# Patient Record
Sex: Male | Born: 1965 | Race: White | Marital: Married | Smoking: Never smoker
Health system: Northeastern US, Academic
[De-identification: ages and names within clinical notes are randomized; demographics above are authoritative.]

## PROBLEM LIST (undated history)

## (undated) DIAGNOSIS — G4730 Sleep apnea, unspecified: Secondary | ICD-10-CM

## (undated) DIAGNOSIS — N323 Diverticulum of bladder: Secondary | ICD-10-CM

## (undated) DIAGNOSIS — I2510 Atherosclerotic heart disease of native coronary artery without angina pectoris: Secondary | ICD-10-CM

## (undated) DIAGNOSIS — N151 Renal and perinephric abscess: Secondary | ICD-10-CM

## (undated) DIAGNOSIS — I10 Essential (primary) hypertension: Secondary | ICD-10-CM

## (undated) DIAGNOSIS — R0600 Dyspnea, unspecified: Secondary | ICD-10-CM

## (undated) DIAGNOSIS — E119 Type 2 diabetes mellitus without complications: Secondary | ICD-10-CM

## (undated) DIAGNOSIS — E108 Type 1 diabetes mellitus with unspecified complications: Secondary | ICD-10-CM

## (undated) HISTORY — DX: Type 1 diabetes mellitus with unspecified complications: E10.8

## (undated) HISTORY — DX: Essential (primary) hypertension: I10

## (undated) HISTORY — PX: COLONOSCOPY: SHX174

## (undated) HISTORY — PX: TONSILLECTOMY: SUR1361

## (undated) HISTORY — PX: WRIST FRACTURE SURGERY: SHX121

## (undated) HISTORY — PX: SHOULDER SURGERY: SHX246

---

## 2006-12-21 ENCOUNTER — Encounter: Admission: RE | Admit: 2006-12-21 | Discharge: 2007-02-10 | Payer: Self-pay | Admitting: Family Medicine

## 2007-02-12 ENCOUNTER — Encounter: Admission: RE | Admit: 2007-02-12 | Discharge: 2007-02-15 | Payer: Self-pay | Admitting: Family Medicine

## 2007-03-29 ENCOUNTER — Ambulatory Visit (HOSPITAL_COMMUNITY): Admission: RE | Admit: 2007-03-29 | Discharge: 2007-03-29 | Payer: Self-pay | Admitting: Orthopedic Surgery

## 2007-04-01 ENCOUNTER — Encounter: Admission: RE | Admit: 2007-04-01 | Discharge: 2007-06-16 | Payer: Self-pay | Admitting: Orthopedic Surgery

## 2010-04-24 ENCOUNTER — Other Ambulatory Visit: Payer: Self-pay | Admitting: Orthopedic Surgery

## 2010-04-24 ENCOUNTER — Ambulatory Visit
Admission: RE | Admit: 2010-04-24 | Discharge: 2010-04-24 | Disposition: A | Discharge status: Home / Self Care | Payer: Self-pay | Source: Ambulatory Visit | Attending: Orthopedic Surgery | Admitting: Orthopedic Surgery

## 2010-04-24 DIAGNOSIS — M25532 Pain in left wrist: Secondary | ICD-10-CM

## 2010-04-24 DIAGNOSIS — R52 Pain, unspecified: Secondary | ICD-10-CM

## 2010-06-25 NOTE — Op Note (Signed)
NAME:  Ryan Ramirez, Ryan Ramirez                   ACCOUNT NO.:  [id number]   MEDICAL RECORD NO.:  [id number]          PATIENT TYPE:  AMB   LOCATION:  SDS                          FACILITY:  [facility]   PHYSICIAN:  Feliberto Gottron. Turner Daniels, M.D.   DATE OF BIRTH:  12-09-1965   DATE OF PROCEDURE:  03/29/2007  DATE OF DISCHARGE:  03/29/2007                               OPERATIVE REPORT   PREOPERATIVE DIAGNOSIS:  Left shoulder adhesive capsulitis chronic with  impingement.   POSTOPERATIVE DIAGNOSIS:  Left shoulder adhesive capsulitis chronic with  impingement.   PROCEDURE:  Left shoulder arthroscopic lysis of adhesions, acromioplasty  and an intra-articular lysis of adhesions as well in the glenohumeral  joint.   SURGEON:  Feliberto Gottron.  Turner Daniels, MD   FIRST ASSISTANT:  Skip Mayer PA-C.   ANESTHETIC:  Left interscalene block with endotracheal.   ESTIMATED BLOOD LOSS:  Minimal.   FLUID REPLACEMENT:  800 mL crystalloid.   DRAINS PLACED:  None.   TOURNIQUET TIME:  None.   INDICATIONS FOR PROCEDURE:  45 year old insulin-dependent diabetic  controlled with an insulin pump and A1c's around 6.  Last year in  another city he had treatment for right shoulder adhesive capsulitis  with physical therapy, anti-inflammatory medicine, ultimately had to  have arthroscopic lysis of adhesions, manipulation and acromioplasty by  another physician in another city.  This went well.  He has developed  left shoulder adhesive capsulitis and blocks at about 5 degrees of  external rotation and once again has failed conservative treatment, with  anti-inflammatory medicines and extensive visits to the physical  therapy, at least 20.  He desires elective lysis of adhesions,  acromioplasty for impingement pain and then immediate physical therapy  to regain motion on the left side.  Risks and benefits of surgery are  well-known and questions have been answered.   DESCRIPTION OF PROCEDURE:  The patient was taken the block area at [facility] and underwent left shoulder interscalene block anesthetic.  He  was then taken to the operating room 2 and underwent general anesthesia  and was placed in the beach chair position, left upper extremity  prepped, draped in usual sterile fashion from the wrist to the  hemithorax and we began the procedure by making standard portals 1.5 cm  anterior to the [facility] joint, lateral to the junction of middle and  posterior thirds of acromion and posterior to the posterolateral corner  of the acromion process.  The inflow was placed anteriorly.  The  arthroscope laterally and a 4.2 great white sucker shaver posteriorly.  We immediately encountered adhesions in the subacromial bursal region  and these were resected revealing an intact rotator cuff.  The patient  did have a 6 mm type 2 subacromial spur.  This was removed with a 4.5  hooded vortex bur and small bleeders were cauterized with a hooked  Bovie.  We then went around the greater tuberosity and lysed some more  adhesions and by this time he had gained 60 degrees of external  rotation.  Flexion was up to about 130 degrees having  been at around 95.  At this point the inflow was attached.  The arthroscope was then  repositioned in the glenohumeral joint where we documented an intact  labrum, biceps, biceps anchor and subscapularis tendon.  The rotator  cuff was intact internally.  There were some small adhesions around the  periphery of the glenoid and these were lysed with a 3.5 gator sucker  shaver.  The shoulder was then irrigated out with normal saline  solution.  The arthroscopic consents were removed and a dressing of  Xeroform, 4x4 dressing sponges, paper tape and a sling applied.  The  patient was laid supine, awakened and taken to the recovery room without  difficulty.      Feliberto Gottron. Turner Daniels, M.D.  Electronically Signed     FJR/MEDQ  D:  03/29/2007  T:  03/30/2007  Job:  [id number]

## 2010-11-01 LAB — COMPREHENSIVE METABOLIC PANEL
ALT: 35
AST: 25
Albumin: 4.2
Alkaline Phosphatase: 45
BUN: 13
Chloride: 103
Creatinine, Ser: 1.1
GFR calc non Af Amer: >60
Glucose, Bld: 185 — ABNORMAL HIGH

## 2010-11-01 LAB — CBC
HCT: 45.1
MCV: 88.1
Platelets: 317
RDW: 13
WBC: 7.8

## 2010-11-01 LAB — DIFFERENTIAL
Basophils Relative: 0
Eosinophils Absolute: 0.2
Eosinophils Relative: 2
Lymphs Abs: 2.8
Monocytes Relative: 11

## 2013-03-14 DIAGNOSIS — E785 Hyperlipidemia, unspecified: Secondary | ICD-10-CM | POA: Insufficient documentation

## 2014-05-08 DIAGNOSIS — E10319 Type 1 diabetes mellitus with unspecified diabetic retinopathy without macular edema: Secondary | ICD-10-CM | POA: Insufficient documentation

## 2016-06-07 DIAGNOSIS — I2510 Atherosclerotic heart disease of native coronary artery without angina pectoris: Secondary | ICD-10-CM | POA: Insufficient documentation

## 2016-11-09 DIAGNOSIS — R0609 Other forms of dyspnea: Secondary | ICD-10-CM | POA: Diagnosis present

## 2016-11-09 NOTE — H&P (Signed)
OFFICE VISIT NOTES COPIED TO EPIC FOR DOCUMENTATION  . History of Present Illness Gwinda Maine FNP-C; 10/22/2016 4:54 PM) Patient words: Last OV 09/09/2016; 4-6 week f/u for sob; starting vitamin c.  The patient is a 51 year old male who presents for a Follow-up for Dyspnea. Patient with past medical history of type 1 diabetes, hyperlipidemia, obstructive sleep apnea, and hypertension presents for follow-up for shortness of breath. Patient was recently noted to have mildly abnormal nuclear stress testing after he complained of dyspnea on exertion and chest discomfort. Dyspnea was felt to be out of porportion of activity. He was started on medical management and is now on losartan and carvedilol that he is tolerating well. He has not had any further episodes of chest pain. He does have dyspnea on exertion and was advised to start regular exercise to see if this was related to previous inactivity. He reports he has not been able to exercise regularly due to having hypoglycemia episodes. He reports recently having insulin adjusted by Endocrinology.  He reports that he feels medications have improved his symptoms slightly; however, does continue to have shortness of breath at night on occasion that feels as though he cannot take a deep breath and dyspnea on exertion that does limit his activities. He had noticed that his heart rate does increase higher than it should with exertion.      Problem List/Past Medical Frances Furbish Johnson; 10/22/2016 3:05 PM) OSA (obstructive sleep apnea) (G47.33)  Benign essential hypertension (I10)  Type 1 diabetes mellitus without complication (C12.7)  Hyperlipidemia, mixed (E78.2)  Shortness of breath on exertion (R06.02)  Echocardiogram 07/16/2016: Left ventricle cavity is normal in size. Normal global wall motion. Normal diastolic filling pattern. Calculated EF 59%. Trivial aortic regurgitation. Trace mitral regurgitation. Trace tricuspid regurgitation. No  evidence of pulmonary hypertension. Trace pulmonic regurgitation. Atypical chest pain (R07.89)  Exercise sestamibi stress test 07/18/2016: 1. The resting electrocardiogram demonstrated normal sinus rhythm, incomplete RBBB and no resting arrhythmias. The stress electrocardiogram was normal. Patient exercised on Bruce protocol for 7:30 minutes and achieved 9:35 METS. Stress test terminated due to fatigue and 90% MPHR achieved (Target HR >85%). There is a moderate area of ischemia in the basal inferior and mid inferior myocardial wall(s). The left ventricular ejection fraction was calculated or visually estimated to be 62%. Overall left ventricular systolic function was normal with possible inferior hypokinesis. This is an intermediate risk study, clinical correlation recommended. Laboratory examination (Z01.89)  06/18/2016: HgbA1c 7.5%. 03/21/2016: Cholesterol 127, triglycerides 65, HDL 49, LDL 65. Creatinine 0.9, potassium 4.6, AST 28, ALT 55, CMP otherwise normal. Abnormal nuclear stress test (R94.39)   Allergies Frances Furbish Johnson; 10/22/2016 3:05 PM) No Known Drug Allergies [07/14/2016]:  Family History Cheri Kearns; 10/22/2016 3:05 PM) Mother  In good health. no heart attacks or strokes, no known cardiovascular conditions Father  In good health. no heart attacks or strokes, no known cardiovascular conditions Siblings  Patient has a total of 2 siblings, all of whom are in stable health with no heart attacks or strokes, no other known cardiovascular conditions  Social History Cheri Kearns; 10/22/2016 3:05 PM) Marital status  Married. Living Situation  Lives with spouse. Number of Children  1. Current tobacco use  Never smoker. Alcohol Use  Moderate alcohol use.  Past Surgical History Cheri Kearns; 10/22/2016 3:05 PM) Arthroscopic Shoulder Surgery - Both [2010]: Tonsillectomy [1976]: Wrist Surgery [2014]: fracture repair  Medication History Frances Furbish Johnson; 10/22/2016 3:13  PM) Carvedilol (6.25MG Tablet, 1 (one) Tablet Oral two  times daily, Taken starting 10/03/2016) Active. Nitroglycerin (0.4MG Tab Sublingual, 1 (one) Tablet Sublingual every 5 minutes as needed for chest pain., Taken starting 09/09/2016) Active. Cialis (20MG Tablet, 1 Oral as needed) Active. Losartan Potassium (25MG Tablet, 1 Oral daily) Active. Simvastatin (20MG Tablet, 1 Oral daily) Active. Aspirin (325MG Tablet, 1 Oral daily) Active. Zinc (10MG Tablet, 1 Oral daily) Active. PhytoMulti (1 Oral daily) Active. K2-D3 (1 daily) Active. CoQ-10 (100MG Capsule, 1 Oral daily) Active. Florify (1 daily) Active. ProvexCV (1 daily) Active. Recover Al (1 daily) Active. HumaLOG (100UNIT/ML Solution, pump Subcutaneous daily) Active. Medications Reconciled (verbally with pt)  Diagnostic Studies History Cheri Kearns; 09-Nov-2016 3:05 PM) Colonoscopy [2013]: Nuclear stress test [07/18/2016]: The resting electrocardiogram demonstrated normal sinus rhythm, incomplete RBBB and no resting arrhythmias. The stress electrocardiogram was normal. Patient exercised on Bruce protocol for 7:30 minutes and achieved 9:35 METS. Stress test terminated due to fatigue and 90% MPHR achieved (Target HR >85%). There is a moderate area of ischemia in the basal inferior and mid inferior myocardial wall(s). The left ventricular ejection fraction was calculated or visually estimated to be 62%. Overall left ventricular systolic function was normal with possible inferior hypokinesis. This is an intermediate risk study, clinical correlation recommended. Sleep Study [2016]: Echocardiogram [07/16/2016]: Left ventricle cavity is normal in size. Normal global wall motion. Normal diastolic filling pattern. Calculated EF 59%. Trivial aortic regurgitation. Trace mitral regurgitation. Trace tricuspid regurgitation. No evidence of pulmonary hypertension. Trace pulmonic regurgitation.    Review of Systems Laverda Page MD; Nov 09, 2016 3:35 PM) General Not Present- Appetite Loss and Weight Gain. Respiratory Not Present- Chronic Cough and Wakes up from Sleep Wheezing or Short of Breath. Cardiovascular Present- Difficulty Breathing Lying Down and Difficulty Breathing On Exertion. Not Present- Chest Pain, Claudications and Palpitations. Gastrointestinal Not Present- Black, Tarry Stool and Difficulty Swallowing. Musculoskeletal Not Present- Decreased Range of Motion and Muscle Atrophy. Neurological Not Present- Attention Deficit. Psychiatric Not Present- Personality Changes and Suicidal Ideation. Endocrine Not Present- Cold Intolerance and Heat Intolerance. Hematology Not Present- Abnormal Bleeding. All other systems negative  Vitals Frances Furbish Johnson; 2016/11/09 3:17 PM) 11-09-2016 3:07 PM Weight: 177.31 lb Height: 68in Body Surface Area: 1.94 m Body Mass Index: 26.96 kg/m  Pulse: 78 (Regular)  P.OX: 99% (Room air) BP: 122/80 (Sitting, Right Arm, Standard)       Physical Exam Gwinda Maine, FNP-C; 09-Nov-2016 4:54 PM) General Mental Status-Alert. General Appearance-Cooperative and Appears stated age. Build & Nutrition-Moderately built.  Head and Neck Thyroid Gland Characteristics - normal size and consistency and no palpable nodules.  Chest and Lung Exam Chest and lung exam reveals -quiet, even and easy respiratory effort with no use of accessory muscles, non-tender and on auscultation, normal breath sounds, no adventitious sounds.  Cardiovascular Cardiovascular examination reveals -normal heart sounds, regular rate and rhythm with no murmurs, carotid auscultation reveals no bruits, abdominal aorta auscultation reveals no bruits and no prominent pulsation, femoral artery auscultation bilaterally reveals normal pulses, no bruits, no thrills, normal pedal pulses bilaterally and no digital clubbing, cyanosis, edema, increased warmth or  tenderness.  Abdomen Palpation/Percussion Normal exam - Non Tender and No hepatosplenomegaly.  Neurologic Neurologic evaluation reveals -alert and oriented x 3 with no impairment of recent or remote memory. Motor-Grossly intact without any focal deficits.  Musculoskeletal Global Assessment Left Lower Extremity - no deformities, masses or tenderness, no known fractures. Right Lower Extremity - no deformities, masses or tenderness, no known fractures.    Assessment & Plan Miquel Dunn Peggye Form FNP-C;  10/22/2016 4:52 PM) Shortness of breath on exertion (R06.02) Story: Echocardiogram 07/16/2016: Left ventricle cavity is normal in size. Normal global wall motion. Normal diastolic filling pattern. Calculated EF 59%. Trivial aortic regurgitation. Trace mitral regurgitation. Trace tricuspid regurgitation. No evidence of pulmonary hypertension. Trace pulmonic regurgitation. Atypical chest pain - Status is Resolved (R07.89) Story: Exercise sestamibi stress test 07/18/2016: 1. The resting electrocardiogram demonstrated normal sinus rhythm, incomplete RBBB and no resting arrhythmias. The stress electrocardiogram was normal. Patient exercised on Bruce protocol for 7:30 minutes and achieved 9:35 METS. Stress test terminated due to fatigue and 90% MPHR achieved (Target HR >85%). There is a moderate area of ischemia in the basal inferior and mid inferior myocardial wall(s). The left ventricular ejection fraction was calculated or visually estimated to be 62%. Overall left ventricular systolic function was normal with possible inferior hypokinesis. This is an intermediate risk study, clinical correlation recommended.  Benign essential hypertension (I10) Impression: EKG 07/14/2016: Normal sinus rhythm at 78 bpm, normal axis, normal interval, no evidence of ischemia. Normal EKG. Type 1 diabetes mellitus without complication (V77.9) Abnormal nuclear stress test (R94.39) Laboratory examination  (T90.30) Story: 11/06/2016: Glucose 1:15, he creatinine 1.02, EGFR 85/99, potassium 5.1, BMP normal.  CBC normal. INR 1.0, prothrombin time 10.0.  06/18/2016: HgbA1c 7.5%.  03/21/2016: Cholesterol 127, triglycerides 65, HDL 49, LDL 65. Creatinine 0.9, potassium 4.6, AST 28, ALT 55, CMP otherwise normal.  Recommendations:  Patient presents for 6 week follow-up for shortness of breath on exertion and abnormal nuclear stress test. Since being on medical management he does feel that symptoms have slightly improved; however, does continue to have dyspnea on exertion and shortness of breath at night that are concerning for angina equivalent symptoms. Given his risk factors and continued symptoms that he does admit have been keeping him from doing normal activities on occasion would recommend further evaluation with coronary angiogram. Schedule for cardiac catheterization, and possible angioplasty. We discussed regarding risks, benefits, alternatives to this including stress testing, CTA and continued medical therapy. Patient wants to proceed. Understands <1-2% risk of death, stroke, MI, urgent CABG, bleeding, infection, renal failure but not limited to these. Blood pressure remains stable. He has recently had insulin adjustment for hypoglycemia. Discussed using glucose tablets as needed. We will see him back after the procedure for further recommendations and evaluation.  *I have discussed this case with Dr. Einar Gip and he personally examined the patient and participated in formulating the plan.*  CC: Dr. Orpah Melter  Signed electronically by Gwinda Maine, FNP-C (10/22/2016 4:55 PM)

## 2016-11-11 ENCOUNTER — Encounter (HOSPITAL_COMMUNITY): Payer: Self-pay | Admitting: Cardiology

## 2016-11-11 ENCOUNTER — Ambulatory Visit (HOSPITAL_COMMUNITY)
Admission: RE | Admit: 2016-11-11 | Discharge: 2016-11-11 | Disposition: A | Discharge status: Home / Self Care | Payer: BLUE CROSS/BLUE SHIELD | Source: Ambulatory Visit | Attending: Cardiology | Admitting: Cardiology

## 2016-11-11 ENCOUNTER — Encounter (HOSPITAL_COMMUNITY)
Admission: RE | Disposition: A | Discharge status: Home / Self Care | Payer: Self-pay | Source: Ambulatory Visit | Attending: Cardiology

## 2016-11-11 DIAGNOSIS — Z794 Long term (current) use of insulin: Secondary | ICD-10-CM | POA: Insufficient documentation

## 2016-11-11 DIAGNOSIS — I10 Essential (primary) hypertension: Secondary | ICD-10-CM | POA: Insufficient documentation

## 2016-11-11 DIAGNOSIS — R0609 Other forms of dyspnea: Secondary | ICD-10-CM | POA: Insufficient documentation

## 2016-11-11 DIAGNOSIS — R9439 Abnormal result of other cardiovascular function study: Secondary | ICD-10-CM | POA: Insufficient documentation

## 2016-11-11 DIAGNOSIS — E109 Type 1 diabetes mellitus without complications: Secondary | ICD-10-CM | POA: Insufficient documentation

## 2016-11-11 DIAGNOSIS — Z7982 Long term (current) use of aspirin: Secondary | ICD-10-CM | POA: Insufficient documentation

## 2016-11-11 DIAGNOSIS — Z79899 Other long term (current) drug therapy: Secondary | ICD-10-CM | POA: Diagnosis not present

## 2016-11-11 DIAGNOSIS — G4733 Obstructive sleep apnea (adult) (pediatric): Secondary | ICD-10-CM | POA: Diagnosis not present

## 2016-11-11 DIAGNOSIS — E785 Hyperlipidemia, unspecified: Secondary | ICD-10-CM | POA: Insufficient documentation

## 2016-11-11 HISTORY — PX: LEFT HEART CATH AND CORONARY ANGIOGRAPHY: CATH118249

## 2016-11-11 HISTORY — PX: ULTRASOUND GUIDANCE FOR VASCULAR ACCESS: SHX6516

## 2016-11-11 LAB — GLUCOSE, CAPILLARY: GLUCOSE-CAPILLARY: 230 mg/dL — AB (ref 65–99)

## 2016-11-11 SURGERY — LEFT HEART CATH AND CORONARY ANGIOGRAPHY
Anesthesia: LOCAL

## 2016-11-11 MED ORDER — SODIUM CHLORIDE 0.9 % WEIGHT BASED INFUSION: 1.0000 mL/kg/h | INTRAVENOUS | Status: DC

## 2016-11-11 MED ORDER — VERAPAMIL HCL 2.5 MG/ML IV SOLN
INTRAVENOUS | Status: DC | PRN
  Administered 2016-11-11: 5 mL via INTRA_ARTERIAL

## 2016-11-11 MED ORDER — FENTANYL CITRATE (PF) 100 MCG/2ML IJ SOLN
INTRAMUSCULAR | Status: AC
  Filled 2016-11-11: qty 2

## 2016-11-11 MED ORDER — NITROGLYCERIN 0.4 MG SL SUBL
SUBLINGUAL_TABLET | SUBLINGUAL | Status: AC
  Filled 2016-11-11: qty 1

## 2016-11-11 MED ORDER — NITROGLYCERIN 0.4 MG SL SUBL
SUBLINGUAL_TABLET | SUBLINGUAL | Status: DC | PRN
  Administered 2016-11-11: .4 mg via SUBLINGUAL

## 2016-11-11 MED ORDER — ASPIRIN 81 MG PO CHEW: 81.0000 mg | CHEWABLE_TABLET | ORAL | Status: DC

## 2016-11-11 MED ORDER — ONDANSETRON HCL 4 MG/2ML IJ SOLN: 4.0000 mg | Freq: Four times a day (QID) | INTRAMUSCULAR | Status: DC | PRN

## 2016-11-11 MED ORDER — FENTANYL CITRATE (PF) 100 MCG/2ML IJ SOLN
INTRAMUSCULAR | Status: DC | PRN
  Administered 2016-11-11: 50 ug via INTRAVENOUS

## 2016-11-11 MED ORDER — SODIUM CHLORIDE 0.9 % IV SOLN: INTRAVENOUS | Status: DC

## 2016-11-11 MED ORDER — ACETAMINOPHEN 325 MG PO TABS: 650.0000 mg | ORAL_TABLET | ORAL | Status: DC | PRN

## 2016-11-11 MED ORDER — LIDOCAINE HCL (PF) 1 % IJ SOLN
INTRAMUSCULAR | Status: DC | PRN
  Administered 2016-11-11: 2 mL

## 2016-11-11 MED ORDER — IOPAMIDOL (ISOVUE-370) INJECTION 76%
INTRAVENOUS | Status: DC | PRN
  Administered 2016-11-11: 55 mL via INTRA_ARTERIAL

## 2016-11-11 MED ORDER — SODIUM CHLORIDE 0.9% FLUSH: 3.0000 mL | Freq: Two times a day (BID) | INTRAVENOUS | Status: DC

## 2016-11-11 MED ORDER — LIDOCAINE HCL 2 % IJ SOLN
INTRAMUSCULAR | Status: AC
  Filled 2016-11-11: qty 10

## 2016-11-11 MED ORDER — MIDAZOLAM HCL 2 MG/2ML IJ SOLN
INTRAMUSCULAR | Status: AC
  Filled 2016-11-11: qty 2

## 2016-11-11 MED ORDER — VERAPAMIL HCL 2.5 MG/ML IV SOLN
INTRAVENOUS | Status: AC
Start: 2016-11-11 — End: ?
  Filled 2016-11-11: qty 2

## 2016-11-11 MED ORDER — HEPARIN (PORCINE) IN NACL 2-0.9 UNIT/ML-% IJ SOLN
INTRAMUSCULAR | Status: AC | PRN
  Administered 2016-11-11: 1000 mL

## 2016-11-11 MED ORDER — SODIUM CHLORIDE 0.9% FLUSH: 3.0000 mL | INTRAVENOUS | Status: DC | PRN

## 2016-11-11 MED ORDER — HEPARIN SODIUM (PORCINE) 1000 UNIT/ML IJ SOLN
INTRAMUSCULAR | Status: DC | PRN
  Administered 2016-11-11: 2000 [IU] via INTRAVENOUS
  Administered 2016-11-11: 3000 [IU] via INTRAVENOUS

## 2016-11-11 MED ORDER — SODIUM CHLORIDE 0.9 % WEIGHT BASED INFUSION
3.0000 mL/kg/h | INTRAVENOUS | Status: AC
  Administered 2016-11-11: 3 mL/kg/h via INTRAVENOUS

## 2016-11-11 MED ORDER — HEPARIN SODIUM (PORCINE) 1000 UNIT/ML IJ SOLN
INTRAMUSCULAR | Status: AC
Start: 2016-11-11 — End: ?
  Filled 2016-11-11: qty 1

## 2016-11-11 MED ORDER — MIDAZOLAM HCL 2 MG/2ML IJ SOLN
INTRAMUSCULAR | Status: DC | PRN
  Administered 2016-11-11: 1 mg via INTRAVENOUS

## 2016-11-11 MED ORDER — IOPAMIDOL (ISOVUE-370) INJECTION 76%
INTRAVENOUS | Status: AC
  Filled 2016-11-11: qty 100

## 2016-11-11 MED ORDER — SODIUM CHLORIDE 0.9 % IV SOLN: 250.0000 mL | INTRAVENOUS | Status: DC | PRN

## 2016-11-11 MED ORDER — HEPARIN (PORCINE) IN NACL 2-0.9 UNIT/ML-% IJ SOLN
INTRAMUSCULAR | Status: AC
  Filled 2016-11-11: qty 1000

## 2016-11-11 SURGICAL SUPPLY — 14 items
CATH INFINITI 5 FR AR1 MOD (CATHETERS) ×3 IMPLANT
CATH INFINITI JR4 5F (CATHETERS) ×3 IMPLANT
CATH OPTITORQUE TIG 4.0 5F (CATHETERS) ×3 IMPLANT
COVER PRB 48X5XTLSCP FOLD TPE (BAG) ×2 IMPLANT
COVER PROBE 5X48 (BAG) ×1
DEVICE RAD COMP TR BAND LRG (VASCULAR PRODUCTS) ×3 IMPLANT
GLIDESHEATH SLEND A-KIT 6F 22G (SHEATH) ×3 IMPLANT
GUIDEWIRE INQWIRE 1.5J.035X260 (WIRE) ×2 IMPLANT
INQWIRE 1.5J .035X260CM (WIRE) ×3
KIT HEART LEFT (KITS) ×3 IMPLANT
PACK CARDIAC CATHETERIZATION (CUSTOM PROCEDURE TRAY) ×3 IMPLANT
TRANSDUCER W/STOPCOCK (MISCELLANEOUS) ×3 IMPLANT
TUBING CIL FLEX 10 FLL-RA (TUBING) ×3 IMPLANT
WIRE HI TORQ VERSACORE-J 145CM (WIRE) ×3 IMPLANT

## 2016-11-11 NOTE — Discharge Instructions (Signed)

## 2016-11-11 NOTE — Progress Notes (Signed)
Diabetes glucose coverage via Humalog is with pump and meter that patient puts reading in manually.  Currently reading is at 199 and patient placed dose based on carb intake and patient just place 3 units.  Currently basal is 1.4 from 6 am to noon. 3 different basal settings.  Daily basal rate is  34.25 per day.  Patient has a medtronic mini med insulin pump.

## 2016-11-11 NOTE — Interval H&P Note (Signed)
History and Physical Interval Note:  11/11/2016 9:18 AM  Ryan Ramirez  has presented today for surgery, with the diagnosis of abnormal stress test  The various methods of treatment have been discussed with the patient and family. After consideration of risks, benefits and other options for treatment, the patient has consented to  Procedure(s): LEFT HEART CATH AND CORONARY ANGIOGRAPHY (N/A) as a surgical intervention .  The patient's history has been reviewed, patient examined, no change in status, stable for surgery.  I have reviewed the patient's chart and labs.  Questions were answered to the patient's satisfaction.    1 Vessel Disease PCI CABG   No proximal LAD involvement, No proximal left dominant LCX involvement M (6); Indication 2 M (4); Indication 2   Proximal left dominant LCX involvement A (7); Indication 5 A (7); Indication 5   Proximal LAD involvement A (7); Indication 5 A (7); Indication 5   newline 2 Vessel Disease  No proximal LAD involvement A (7); Indication 8 M (6); Indication 8   Proximal LAD involvement A (7); Indication 14 A (8); Indication 14   newline 3 Vessel Disease  Low disease complexity (e.g., focal stenoses, SYNTAX <=22) A (7); Indication 19 A (8); Indication 19   Intermediate or high disease complexity (e.g., SYNTAX >=23) M (5); Indication 23 A (8); Indication 23   newline Left Main Disease  Isolated LMCA disease: ostial or midshaft A (7); Indication 24 A (9); Indication 24   Isolated LMCA disease: bifurcation involvement M (5); Indication 25 A (9); Indication 25   LMCA ostial or midshaft, concurrent low disease burden multivessel disease (e.g., 1-2 additional focal stenoses, SYNTAX <=22) A (7); Indication 26 A (9); Indication 26   LMCA ostial or midshaft, concurrent intermediate or high disease burden multivessel disease (e.g., 1-2 additional bifurcation stenoses, long stenoses, SYNTAX >=23) M (4); Indication 27 A (9); Indication 27   LMCA bifurcation involvement,  concurrent low disease burden multivessel disease (e.g., 1-2 additional focal stenoses, SYNTAX <=22) M (5); Indication 28 A (9); Indication 28   LMCA bifurcation involvement, concurrent intermediate or high disease burden multivessel disease (e.g., 1-2 additional bifurcation stenoses, long stenoses, SYNTAX >=23) R (3); Indication 29 A (9); Indication 29          [name] [name]

## 2016-11-12 MED FILL — Lidocaine HCl Local Inj 2%: INTRAMUSCULAR | Qty: 10 | Status: AC

## 2017-01-26 ENCOUNTER — Other Ambulatory Visit: Payer: Self-pay | Admitting: Family Medicine

## 2017-01-26 DIAGNOSIS — R229 Localized swelling, mass and lump, unspecified: Secondary | ICD-10-CM

## 2017-01-29 ENCOUNTER — Ambulatory Visit
Admission: RE | Admit: 2017-01-29 | Discharge: 2017-01-29 | Disposition: A | Discharge status: Home / Self Care | Payer: BLUE CROSS/BLUE SHIELD | Source: Ambulatory Visit | Attending: Family Medicine | Admitting: Family Medicine

## 2017-01-29 DIAGNOSIS — R229 Localized swelling, mass and lump, unspecified: Secondary | ICD-10-CM

## 2017-01-29 MED ORDER — IOPAMIDOL (ISOVUE-300) INJECTION 61%
60.0000 mL | Freq: Once | INTRAVENOUS | Status: AC | PRN
  Administered 2017-01-29: 60 mL via INTRAVENOUS

## 2017-02-19 ENCOUNTER — Ambulatory Visit: Payer: Self-pay | Admitting: Surgery

## 2017-02-19 NOTE — H&P (Signed)
Henderson BaltimoreJoel R Rigaud Documented: 02/19/2017 10:22 AM Location: [facility] Patient #: [id number] DOB: April 30, 1965 Married / Language: [place] / Race: White Male  History of Present Illness (Chelsea A. Fredricka Bonineonnor MD; 02/19/2017 10:51 AM) Patient words: This is a very nice 52 year old gentleman who is referred for essentially chronic renal abscess. He started having issues in November of last year with swelling and pain in the right anterior perianal field. This was treated with several courses of oral antibiotics with some decrease in size and symptoms. He underwent a CT of the pelvis which revealed a persistent small abscess as well as a bladder diverticulum. The abscesses, small to the point that it is essentially asymptomatic at this time. It has never drained. Reports normal bowel movements. 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 He notes that this was actually worked up about 12 years ago as he was experiencing urgency and frequency and he was thought to have a bladder tumor however cystoscopy demonstrated a bladder diverticulum that was causing his symptoms.. He's had some return of the symptoms in the last few weeks for which Dr. Louellen MolderWehner is planning to do a cystoscopy, right retrograde pyelogram and possible ureteroscopy with possible biopsies and stent placement.  His medical history is notable for type 2 diabetes with an insulin pump, coronary artery disease, hyperlipidemia, hypertension and sleep apnea. From a surgical standpoint he's had a tonsillectomy and a left wrist arthroscopy in addition to the aforementioned cystoscopy.  The patient is a 52 year old male.   Past Surgical History (Tanisha A. Manson PasseyBrown, RMA; 02/19/2017 10:22 AM) Carotid Artery Surgery Left. Colon Polyp Removal - Colonoscopy Tonsillectomy  Diagnostic Studies History (Tanisha A. Manson PasseyBrown, RMA; 02/19/2017 10:22 AM) Colonoscopy 1-5  years ago  Allergies (Tanisha A. Manson PasseyBrown, RMA; 02/19/2017 10:24 AM) No Known Drug Allergies [02/19/2017]: Allergies Reconciled  Medication History (Tanisha A. Manson PasseyBrown, RMA; 02/19/2017 10:24 AM) Carvedilol (6.25MG  Tablet, Oral) Active. FreeStyle Morgan StanleyLibre Sensor System Active. HumaLOG (100UNIT/ML Solution, Subcutaneous) Active. LevoFLOXacin (500MG  Tablet, Oral) Active. Losartan Potassium (25MG  Tablet, Oral) Active. Nitroglycerin (0.4MG  Tab Sublingual, Sublingual) Active. Rosuvastatin Calcium (20MG  Tablet, Oral) Active. Simvastatin (20MG  Tablet, Oral) Active. Tamsulosin HCl (0.4MG  Capsule, Oral) Active. Medications Reconciled  Social History (Tanisha A. Manson PasseyBrown, RMA; 02/19/2017 10:22 AM) Alcohol use Occasional alcohol use. Caffeine use Coffee. No drug use Tobacco use Never smoker.  Family History (Tanisha A. Manson PasseyBrown, RMA; 02/19/2017 10:22 AM) Alcohol Abuse Father.  Other Problems (Tanisha A. Manson PasseyBrown, RMA; 02/19/2017 10:22 AM) Diabetes Mellitus High blood pressure Hypercholesterolemia Sleep Apnea     Review of Systems (Tanisha A. Brown RMA; 02/19/2017 10:22 AM) General Not Present- Appetite Loss, Chills, Fatigue, Fever, Night Sweats, Weight Gain and Weight Loss. Skin Not Present- Change in Wart/Mole, Dryness, Hives, Jaundice, New Lesions, Non-Healing Wounds, Rash and Ulcer. HEENT Present- Wears glasses/contact lenses. Not Present- Earache, Hearing Loss, Hoarseness, Nose Bleed, Oral Ulcers, Ringing in the Ears, Seasonal Allergies, Sinus Pain, Sore Throat, Visual Disturbances and Yellow Eyes. Respiratory Present- Snoring. Not Present- Bloody sputum, Chronic Cough, Difficulty Breathing and Wheezing. Breast Not Present- Breast Mass, Breast Pain, Nipple Discharge and Skin Changes. Cardiovascular Not Present- Chest Pain, Difficulty Breathing Lying Down, Leg Cramps, Palpitations, Rapid Heart Rate, Shortness of Breath and Swelling of Extremities. Gastrointestinal Not Present-  Abdominal Pain, Bloating, Bloody Stool, Change in Bowel Habits, Chronic diarrhea, Constipation, Difficulty Swallowing, Excessive gas, Gets full quickly at meals, Hemorrhoids, Indigestion, Nausea, Rectal Pain and Vomiting. Male Genitourinary Present- Frequency.  Not Present- Blood in Urine, Change in Urinary Stream, Impotence, Nocturia, Painful Urination, Urgency and Urine Leakage. Musculoskeletal Not Present- Back Pain, Joint Pain, Joint Stiffness, Muscle Pain, Muscle Weakness and Swelling of Extremities. Neurological Not Present- Decreased Memory, Fainting, Headaches, Numbness, Seizures, Tingling, Tremor, Trouble walking and Weakness. Psychiatric Not Present- Anxiety, Bipolar, Change in Sleep Pattern, Depression, Fearful and Frequent crying. Endocrine Not Present- Cold Intolerance, Excessive Hunger, Hair Changes, Heat Intolerance, Hot flashes and New Diabetes. Hematology Not Present- Blood Thinners, Easy Bruising, Excessive bleeding, Gland problems, HIV and Persistent Infections.  Vitals (Tanisha A. Brown RMA; 02/19/2017 10:23 AM) 02/19/2017 10:23 AM Weight: 180.2 lb Height: 68in Body Surface Area: 1.96 m Body Mass Index: 27.4 kg/m  Temp.: 98.81F  Pulse: 82 (Regular)  BP: 118/76 (Sitting, Left Arm, Standard)      Physical Exam (Chelsea A. Fredricka Bonine MD; 02/19/2017 10:53 AM)  General Note: alert and well appearing  Integumentary Note: warm and dry  Head and Neck Note: no mass or thyromegaly  Eye Note: No scleral icterus. Extra ocular motions intact.  ENMT Note: Moist mucous membranes, dentition intact  Chest and Lung Exam Note: Unlabored respirations, clear bilaterally  Cardiovascular Note: Regular rate and rhythm, no pedal edema  Abdomen Note: Soft, nontender nondistended. No mass or organomegaly.  Rectal Note: No visually abnormal lesions around the anus or perianal tissues. Digital rectal exam deferred. In the right anterior aspect there is a  subcutaneous mobile nodule consistent with chronic abscess cavity or subcutaneous cyst. No overlying cellulitis, no tenderness or induration or warmth.  Neurologic Note: Grossly intact, normal gait  Neuropsychiatric Note: Normal mood and affect. Appropriate insight.  Musculoskeletal Note: Strength symmetrical throughout, no deformity    Assessment & Plan (Chelsea A. Connor MD; 02/19/2017 10:54 AM)  PERIANAL ABSCESS (K61.0) Story: Essentially chronic at this point an asymptomatic however still present. Discussed rectal examination is, excision of subcutaneous cyst versus I&D of chronic perianal abscess which we will coordinate with Dr. Liliane Shi to do a combined surgery with his planned cystoscopy. Discussed open wound, possibility of perianal fistula, risks of pain, bleeding, recurrence of abscess, etc.

## 2017-03-05 ENCOUNTER — Other Ambulatory Visit: Payer: Self-pay | Admitting: Urology

## 2017-03-10 ENCOUNTER — Other Ambulatory Visit: Payer: Self-pay

## 2017-03-10 ENCOUNTER — Encounter (HOSPITAL_BASED_OUTPATIENT_CLINIC_OR_DEPARTMENT_OTHER): Payer: Self-pay

## 2017-03-10 NOTE — Progress Notes (Signed)
Spoke with:  Francis DowseJoel NPO:  After Midnight, no gum, candy, or mints   Arrival time:  8:15AM Labs:  Istat 4, EKG AM medications:  Hibiclens, Fleet Enema, Insulin Pump, Carvedilol, Losartan, Rosuvastatin, Flomax Pre op orders: Yes Ride home:  Sue Lushndrea (wife) [phone]

## 2017-03-10 NOTE — Progress Notes (Signed)
I spoke with Carollee HerterShannon, RN Diabetes coordinator she is aware of Mr. Serita KyleRose's insulin pump and procedure scheduled for 03/25/17.  [facility] Mr[facility] Spring's diabetes and insulin pump.

## 2017-03-25 ENCOUNTER — Ambulatory Visit (HOSPITAL_BASED_OUTPATIENT_CLINIC_OR_DEPARTMENT_OTHER)
Admission: RE | Admit: 2017-03-25 | Discharge: 2017-03-25 | Disposition: A | Discharge status: Home / Self Care | Payer: BLUE CROSS/BLUE SHIELD | Source: Ambulatory Visit | Attending: Urology | Admitting: Urology

## 2017-03-25 ENCOUNTER — Ambulatory Visit (HOSPITAL_BASED_OUTPATIENT_CLINIC_OR_DEPARTMENT_OTHER): Payer: BLUE CROSS/BLUE SHIELD | Admitting: Anesthesiology

## 2017-03-25 ENCOUNTER — Encounter (HOSPITAL_BASED_OUTPATIENT_CLINIC_OR_DEPARTMENT_OTHER): Payer: Self-pay | Admitting: *Deleted

## 2017-03-25 ENCOUNTER — Encounter (HOSPITAL_BASED_OUTPATIENT_CLINIC_OR_DEPARTMENT_OTHER)
Admission: RE | Disposition: A | Discharge status: Home / Self Care | Payer: Self-pay | Source: Ambulatory Visit | Attending: Urology

## 2017-03-25 ENCOUNTER — Other Ambulatory Visit: Payer: Self-pay

## 2017-03-25 DIAGNOSIS — K610 Anal abscess: Secondary | ICD-10-CM | POA: Insufficient documentation

## 2017-03-25 DIAGNOSIS — G4730 Sleep apnea, unspecified: Secondary | ICD-10-CM | POA: Insufficient documentation

## 2017-03-25 DIAGNOSIS — N323 Diverticulum of bladder: Secondary | ICD-10-CM | POA: Insufficient documentation

## 2017-03-25 DIAGNOSIS — E1151 Type 2 diabetes mellitus with diabetic peripheral angiopathy without gangrene: Secondary | ICD-10-CM | POA: Insufficient documentation

## 2017-03-25 DIAGNOSIS — I2510 Atherosclerotic heart disease of native coronary artery without angina pectoris: Secondary | ICD-10-CM | POA: Diagnosis not present

## 2017-03-25 DIAGNOSIS — I10 Essential (primary) hypertension: Secondary | ICD-10-CM | POA: Diagnosis not present

## 2017-03-25 DIAGNOSIS — N3289 Other specified disorders of bladder: Secondary | ICD-10-CM | POA: Diagnosis not present

## 2017-03-25 DIAGNOSIS — Z794 Long term (current) use of insulin: Secondary | ICD-10-CM | POA: Diagnosis not present

## 2017-03-25 DIAGNOSIS — Z79899 Other long term (current) drug therapy: Secondary | ICD-10-CM | POA: Insufficient documentation

## 2017-03-25 HISTORY — DX: Atherosclerotic heart disease of native coronary artery without angina pectoris: I25.10

## 2017-03-25 HISTORY — DX: Sleep apnea, unspecified: G47.30

## 2017-03-25 HISTORY — DX: Type 2 diabetes mellitus without complications: E11.9

## 2017-03-25 HISTORY — DX: Diverticulum of bladder: N32.3

## 2017-03-25 HISTORY — DX: Renal and perinephric abscess: N15.1

## 2017-03-25 HISTORY — DX: Dyspnea, unspecified: R06.00

## 2017-03-25 HISTORY — PX: CYSTOSCOPY WITH BIOPSY: SHX5122

## 2017-03-25 HISTORY — DX: Essential (primary) hypertension: I10

## 2017-03-25 LAB — POCT I-STAT 4, (NA,K, GLUC, HGB,HCT)
Glucose, Bld: 163 mg/dL — ABNORMAL HIGH (ref 65–99)
HEMATOCRIT: 42 % (ref 39.0–52.0)
HEMOGLOBIN: 14.3 g/dL (ref 13.0–17.0)
POTASSIUM: 4.2 mmol/L (ref 3.5–5.1)
SODIUM: 143 mmol/L (ref 135–145)

## 2017-03-25 LAB — GLUCOSE, CAPILLARY: GLUCOSE-CAPILLARY: 121 mg/dL — AB (ref 65–99)

## 2017-03-25 SURGERY — CYSTOSCOPY, WITH BIOPSY
Anesthesia: General

## 2017-03-25 MED ORDER — KETOROLAC TROMETHAMINE 30 MG/ML IJ SOLN
INTRAMUSCULAR | Status: DC | PRN
  Administered 2017-03-25: 30 mg via INTRAVENOUS

## 2017-03-25 MED ORDER — FLEET ENEMA 7-19 GM/118ML RE ENEM
1.0000 | ENEMA | Freq: Once | RECTAL | Status: DC
  Filled 2017-03-25: qty 1

## 2017-03-25 MED ORDER — ONDANSETRON HCL 4 MG PO TABS: 4.0000 mg | ORAL_TABLET | Freq: Every day | ORAL | 1 refills | Status: AC | PRN

## 2017-03-25 MED ORDER — MIDAZOLAM HCL 5 MG/5ML IJ SOLN
INTRAMUSCULAR | Status: DC | PRN
  Administered 2017-03-25: 2 mg via INTRAVENOUS

## 2017-03-25 MED ORDER — GABAPENTIN 300 MG PO CAPS
300.0000 mg | ORAL_CAPSULE | ORAL | Status: DC
  Filled 2017-03-25: qty 1

## 2017-03-25 MED ORDER — LACTATED RINGERS IV SOLN
INTRAVENOUS | Status: DC
  Administered 2017-03-25: 09:00:00 via INTRAVENOUS
  Administered 2017-03-25: 1000 mL via INTRAVENOUS
  Filled 2017-03-25: qty 1000

## 2017-03-25 MED ORDER — ONDANSETRON HCL 4 MG/2ML IJ SOLN
INTRAMUSCULAR | Status: AC
  Filled 2017-03-25: qty 2

## 2017-03-25 MED ORDER — ACETAMINOPHEN 500 MG PO TABS
1000.0000 mg | ORAL_TABLET | ORAL | Status: DC
  Filled 2017-03-25: qty 2

## 2017-03-25 MED ORDER — DEXAMETHASONE SODIUM PHOSPHATE 10 MG/ML IJ SOLN
INTRAMUSCULAR | Status: AC
  Filled 2017-03-25: qty 1

## 2017-03-25 MED ORDER — CIPROFLOXACIN IN D5W 400 MG/200ML IV SOLN
400.0000 mg | INTRAVENOUS | Status: DC
  Filled 2017-03-25: qty 200

## 2017-03-25 MED ORDER — METRONIDAZOLE IN NACL 5-0.79 MG/ML-% IV SOLN
500.0000 mg | INTRAVENOUS | Status: DC
  Filled 2017-03-25 (×2): qty 100

## 2017-03-25 MED ORDER — GABAPENTIN 300 MG PO CAPS
ORAL_CAPSULE | ORAL | Status: AC
  Filled 2017-03-25: qty 1

## 2017-03-25 MED ORDER — MIDAZOLAM HCL 2 MG/2ML IJ SOLN
INTRAMUSCULAR | Status: AC
  Filled 2017-03-25: qty 2

## 2017-03-25 MED ORDER — CHLORHEXIDINE GLUCONATE 4 % EX LIQD
60.0000 mL | Freq: Once | CUTANEOUS | Status: DC
  Filled 2017-03-25: qty 118

## 2017-03-25 MED ORDER — PROPOFOL 10 MG/ML IV BOLUS
INTRAVENOUS | Status: AC
  Filled 2017-03-25: qty 40

## 2017-03-25 MED ORDER — PROPOFOL 10 MG/ML IV BOLUS
INTRAVENOUS | Status: DC | PRN
  Administered 2017-03-25: 150 mg via INTRAVENOUS

## 2017-03-25 MED ORDER — PHENAZOPYRIDINE HCL 200 MG PO TABS: 200.0000 mg | ORAL_TABLET | Freq: Three times a day (TID) | ORAL | 0 refills | Status: AC | PRN

## 2017-03-25 MED ORDER — FLEET ENEMA 7-19 GM/118ML RE ENEM
1.0000 | ENEMA | Freq: Once | RECTAL | Status: AC
  Administered 2017-03-25: 1 via RECTAL
  Filled 2017-03-25: qty 1

## 2017-03-25 MED ORDER — CEFAZOLIN SODIUM-DEXTROSE 2-4 GM/100ML-% IV SOLN
INTRAVENOUS | Status: AC
  Filled 2017-03-25: qty 100

## 2017-03-25 MED ORDER — STERILE WATER FOR IRRIGATION IR SOLN
Status: DC | PRN
  Administered 2017-03-25: 3000 mL

## 2017-03-25 MED ORDER — SULFAMETHOXAZOLE-TRIMETHOPRIM 800-160 MG PO TABS: 1.0000 | ORAL_TABLET | Freq: Two times a day (BID) | ORAL | 0 refills | Status: AC

## 2017-03-25 MED ORDER — CIPROFLOXACIN IN D5W 400 MG/200ML IV SOLN
INTRAVENOUS | Status: AC
  Filled 2017-03-25: qty 200

## 2017-03-25 MED ORDER — LIDOCAINE 2% (20 MG/ML) 5 ML SYRINGE
INTRAMUSCULAR | Status: AC
  Filled 2017-03-25: qty 5

## 2017-03-25 MED ORDER — ACETAMINOPHEN 500 MG PO TABS
ORAL_TABLET | ORAL | Status: AC
  Filled 2017-03-25: qty 2

## 2017-03-25 MED ORDER — FENTANYL CITRATE (PF) 100 MCG/2ML IJ SOLN
INTRAMUSCULAR | Status: DC | PRN
  Administered 2017-03-25 (×2): 25 ug via INTRAVENOUS
  Administered 2017-03-25: 50 ug via INTRAVENOUS

## 2017-03-25 MED ORDER — FENTANYL CITRATE (PF) 100 MCG/2ML IJ SOLN
INTRAMUSCULAR | Status: AC
  Filled 2017-03-25: qty 2

## 2017-03-25 MED ORDER — KETOROLAC TROMETHAMINE 30 MG/ML IJ SOLN
INTRAMUSCULAR | Status: AC
  Filled 2017-03-25: qty 1

## 2017-03-25 MED ORDER — CEFAZOLIN SODIUM-DEXTROSE 2-4 GM/100ML-% IV SOLN
2.0000 g | Freq: Once | INTRAVENOUS | Status: DC
  Filled 2017-03-25: qty 100

## 2017-03-25 MED ORDER — LIDOCAINE 2% (20 MG/ML) 5 ML SYRINGE
INTRAMUSCULAR | Status: DC | PRN
  Administered 2017-03-25: 100 mg via INTRAVENOUS

## 2017-03-25 MED ORDER — ONDANSETRON HCL 4 MG/2ML IJ SOLN
INTRAMUSCULAR | Status: DC | PRN
  Administered 2017-03-25: 4 mg via INTRAVENOUS

## 2017-03-25 SURGICAL SUPPLY — 19 items
BAG DRAIN URO-CYSTO SKYTR STRL (DRAIN) ×3 IMPLANT
CATH ROBINSON RED A/P 14FR (CATHETERS) IMPLANT
CATH URET 5FR 28IN OPEN ENDED (CATHETERS) ×3 IMPLANT
CLOTH BEACON ORANGE TIMEOUT ST (SAFETY) ×3 IMPLANT
ELECT REM PT RETURN 9FT ADLT (ELECTROSURGICAL) ×3
ELECTRODE REM PT RTRN 9FT ADLT (ELECTROSURGICAL) ×1 IMPLANT
GLOVE BIO SURGEON STRL SZ7.5 (GLOVE) ×3 IMPLANT
GOWN STRL REUS W/ TWL LRG LVL3 (GOWN DISPOSABLE) ×3 IMPLANT
GOWN STRL REUS W/TWL LRG LVL3 (GOWN DISPOSABLE) ×6
GUIDEWIRE ANG ZIPWIRE 038X150 (WIRE) ×3 IMPLANT
KIT RM TURNOVER CYSTO AR (KITS) ×3 IMPLANT
MANIFOLD NEPTUNE II (INSTRUMENTS) IMPLANT
NEEDLE HYPO 18GX1.5 BLUNT FILL (NEEDLE) IMPLANT
PACK CYSTO (CUSTOM PROCEDURE TRAY) ×3 IMPLANT
SYR 10ML LL (SYRINGE) ×3 IMPLANT
SYR 20CC LL (SYRINGE) IMPLANT
TUBE CONNECTING 12'X1/4 (SUCTIONS)
TUBE CONNECTING 12X1/4 (SUCTIONS) IMPLANT
WATER STERILE IRR 3000ML UROMA (IV SOLUTION) ×3 IMPLANT

## 2017-03-25 NOTE — Anesthesia Postprocedure Evaluation (Signed)
Anesthesia Post Note  Patient: Ryan Ramirez  Procedure(s) Performed: CYSTOSCOPY WITH BLADDER BIOPSY,  bilateral retrograde (N/A )     Patient location during evaluation: PACU Anesthesia Type: General Level of consciousness: awake and alert Pain management: pain level controlled Vital Signs Assessment: post-procedure vital signs reviewed and stable Respiratory status: spontaneous breathing, nonlabored ventilation, respiratory function stable and patient connected to nasal cannula oxygen Cardiovascular status: blood pressure returned to baseline and stable Postop Assessment: no apparent nausea or vomiting Anesthetic complications: no    Last Vitals:  Vitals:   03/25/17 1045 03/25/17 1047  BP: (!) 96/59 (!) 96/59  Pulse: 77 73  Resp: (!) 22 12  Temp:    SpO2: 100% 100%    Last Pain:  Vitals:   03/25/17 0805  TempSrc: Oral                 Jerkins,Sejla Marzano S

## 2017-03-25 NOTE — Anesthesia Procedure Notes (Signed)
Procedure Name: LMA Insertion Date/Time: 03/25/2017 9:48 AM Performed by: Jessica PriestBeeson, Alphonsus Doyel C, CRNA Pre-anesthesia Checklist: Patient identified, Emergency Drugs available, Suction available and Patient being monitored Patient Re-evaluated:Patient Re-evaluated prior to induction Oxygen Delivery Method: Circle system utilized Preoxygenation: Pre-oxygenation with 100% oxygen Induction Type: IV induction Ventilation: Mask ventilation without difficulty LMA: LMA inserted LMA Size: 4.0 Number of attempts: 1 Airway Equipment and Method: Bite block Placement Confirmation: positive ETCO2 Tube secured with: Tape Dental Injury: Teeth and Oropharynx as per pre-operative assessment

## 2017-03-25 NOTE — Transfer of Care (Signed)
Immediate Anesthesia Transfer of Care Note  Patient: Ryan LoganJoel Ramirez  Procedure(s) Performed: Procedure(s) (LRB): CYSTOSCOPY WITH BLADDER BIOPSY (N/A)  Patient Location: PACU  Anesthesia Type: General  Level of Consciousness: awake, sedated, patient cooperative and responds to stimulation  Airway & Oxygen Therapy: Patient Spontanous Breathing and Patient connected to  O2  Post-op Assessment: Report given to PACU RN, Post -op Vital signs reviewed and stable and Patient moving all extremities  Post vital signs: Reviewed and stable  Complications: No apparent anesthesia complications

## 2017-03-25 NOTE — H&P (Signed)
Urology Preoperative H&P   Chief Complaint: bladder lesion  History of Present Illness: Ryan Ramirez is a 52 y.o. male referred by Dr. Joycelyn RuaStephen Ramirez after he was found to have bladder wall thickening adjacent to the right ureteral orifice and a possible bladder diverticulum in the same vacinity on CT pelvis with contrast from 01/29/17. He was also noted to a have 3.5 cm peri-anal abscess on that recent CT, which is what prompted the scan. He is currently on his second course of abx. He was initially started on Augmentin and states that the abscess significantly decreased in size and is much less painful, but is still palpable. He is unsure what abx he is currently taking, but states that the lesion has not changed in consistency since starting therapy.   From a voiding standpoint, he reports a weak FOS, PVD, some daytime urgency/frequency and occasional nocturia x1-2. Remote history of UTIs, but reports none in the past year. No prior history of kidney stones.   03/05/17- Today, he reports progressive improvement in his peri-anal discomfort and he cannot palpate the area that was present on his initial exam. From a voiding standpoint, he states that his FOS and urgency/frequency are improved. He feels like his voiding sxs are most improved when he has tighter blood glucose control. Denies interval UTIs, dysuria or hematuria.     Past Medical History:  Diagnosis Date  . Bladder diverticulum   . Coronary artery disease    mid LAD 50% stenosis  . Diabetes mellitus without complication (HCC)   . Dyspnea    exertional dyspnea  . Hypertension   . Renal abscess    chronic  . Sleep apnea    does not use CPAP, will start using mouth guard    Past Surgical History:  Procedure Laterality Date  . COLONOSCOPY    . LEFT HEART CATH AND CORONARY ANGIOGRAPHY N/A 11/11/2016   Procedure: LEFT HEART CATH AND CORONARY ANGIOGRAPHY;  Surgeon: Ryan Ramirez, Ryan J, MD;  Location: [facility] IN[facility]ASIVE CV [facility];  Service:  Cardiovascular;  Laterality: N/A;  . SHOULDER SURGERY Left   . SHOULDER SURGERY Right   . TONSILLECTOMY    . ULTRASOUND GUIDANCE FOR VASCULAR ACCESS  11/11/2016   Procedure: Ultrasound Guidance For Vascular Access;  Surgeon: Ryan Ramirez, Ryan J, MD;  [facility];  Service: Cardiovascular;;  . WRIST FRACTURE SURGERY      Allergies: No Known Allergies  History reviewed. No pertinent family history.  Social History:  reports that  has never smoked. he has never used smokeless tobacco. He reports that he drinks alcohol. He reports that he does not use drugs.  ROS: A complete review of systems was performed.  All systems are negative except for pertinent findings as noted.  Physical Exam:  Vital signs in last 24 hours:   Constitutional:  Alert and oriented, No acute distress Cardiovascular: Regular rate and rhythm, No JVD Respiratory: Normal respiratory effort, Lungs clear bilaterally GI: Abdomen is soft, nontender, nondistended, no abdominal masses GU: No CVA tenderness Lymphatic: No lymphadenopathy Neurologic: Grossly intact, no focal deficits Psychiatric: Normal mood and affect  Laboratory Data:  No results for input(s): WBC, HGB, HCT, PLT in the last 72 hours.  No results for input(s): NA, K, CL, GLUCOSE, BUN, CALCIUM, CREATININE in the last 72 hours.  Invalid input(s): CO3   No results found for this or any previous visit (from the past 24 hour(s)). No results found for this or any previous visit (from the past  240 hour(s)).  Renal Function: No results for input(s): CREATININE in the last 168 hours. CrCl cannot be calculated (Patient's most recent lab result is older than the maximum 21 days allowed.).  Radiologic Imaging: No results found.  I independently reviewed the above imaging studies.  Assessment and Plan Ryan Ramirez is a 52 y.o. male with a bladder lesion adjacent to the right ureteral orifice   -The risks, benefits and alternatives of  cystoscopy with right RPG, possible right ureteroscopy, possible bladder and/or ureteral biopsy and right JJ stent placement was discussed with the patient. Risks include but are not limited to bleeding complications, UTI, ureteral stricture disease, prolonged ureteral stent implantation, prolonged bladder catheterization, possible bladder perforation requiring surgical repair, benign pathology and the inherent risks with general anesthesia.   Ryan Moody, MD 03/25/2017, 8:05 AM  [facility] Pager: [phone]

## 2017-03-25 NOTE — Op Note (Signed)
Operative Note  Preoperative diagnosis:  1.  Right bladder wall lesion/thickening  Postoperative diagnosis: 1.  Right bladder wall lesion/thickening 2.  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  Solitary left collecting system  Procedure(s): 1.  Cystoscopy 2.  Bilateral retrograde pyelograms with intraoperative interpretation of fluoroscopic imaging  3.  Bladder biopsy   Surgeon: Rhoderick Moodyhristopher Kenyona Rena, MD  Assistants: None  Anesthesia: General LMA  Complications: None  EBL: Less than 5 mL  Specimens: 1.  Right bladder wall biopsy  Drains/Catheters: 1.  None  Intraoperative findings:   1.  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  Solitary left collecting system with no filling defects or dilation involving the left ureter or left renal pelvis seen on retrograde pyelogram   Indication:  Ryan Ramirez is a [age] y.o. male  referred by Dr. Joycelyn RuaStephen Meyers after he was found to have bladder wall thickening adjacent to the right ureteral orifice and a possible bladder diverticulum in the same vacinity on CT pelvis with contrast from 01/29/17. He was also noted to a have 3.5 cm peri-anal abscess on that recent CT, which is what prompted the scan. (His perianal abscess has since resolved).   Description of procedure: After informed consent was obtained, the patient was brought to the operating room and general LMA anesthesia was administered. The patient was then placed in the dorsolithotomy position and prepped and draped in usual sterile fashion. A timeout was performed. A 21 French rigid cystoscope was then inserted into the urethral meatus and advanced into the bladder under direct vision. A complete bladder survey revealed 2 completely separate right ureteral orifices with intussusception of the right distal ureter into the right bladder wall.  The left ureteral orifice was  singular and normal in appearance.  Bilateral retrograde pyelograms were obtained with the findings listed above.  A cold cup biopsy was then taken to the thickened bladder wall surrounding the right ureteral orifices and sent to pathology for analysis.  The area that was biopsied was then extensively fulgurated with electrocautery until hemostasis was achieved.  The patient's bladder was.  He tolerated the procedure well and was transferred to the postanesthesia unit in stable condition.   Plan: I will call the patient with his biopsy results.  He can follow-up in 1 year as needed  Cc: Dr. Joycelyn RuaStephen Meyers

## 2017-03-25 NOTE — OR Nursing (Signed)
15 ml of omnipauqe given during procedure by dr winter

## 2017-03-25 NOTE — Anesthesia Preprocedure Evaluation (Signed)
Anesthesia Evaluation  Patient identified by MRN, date of birth, ID band Patient awake    Reviewed: Allergy & Precautions, NPO status , Patient's Chart, lab work & pertinent test results  Airway Mallampati: II  TM Distance: >3 FB Neck ROM: Full    Dental no notable dental hx.    Pulmonary sleep apnea ,    Pulmonary exam normal breath sounds clear to auscultation       Cardiovascular hypertension, + CAD  Normal cardiovascular exam Rhythm:Regular Rate:Normal     Neuro/Psych negative neurological ROS  negative psych ROS   GI/Hepatic negative GI ROS, Neg liver ROS,   Endo/Other  diabetes, Insulin Dependent  Renal/GU negative Renal ROS  negative genitourinary   Musculoskeletal negative musculoskeletal ROS (+)   Abdominal   Peds negative pediatric ROS (+)  Hematology negative hematology ROS (+)   Anesthesia Other Findings   Reproductive/Obstetrics negative OB ROS                             Anesthesia Physical Anesthesia Plan  ASA: III  Anesthesia Plan: General   Post-op Pain Management:    Induction: Intravenous  PONV Risk Score and Plan: 2 and Ondansetron and Treatment may vary due to age or medical condition  Airway Management Planned: LMA  Additional Equipment:   Intra-op Plan:   Post-operative Plan: Extubation in OR  Informed Consent: I have reviewed the patients History and Physical, chart, labs and discussed the procedure including the risks, benefits and alternatives for the proposed anesthesia with the patient or authorized representative who has indicated his/her understanding and acceptance.   Dental advisory given  Plan Discussed with: CRNA and Surgeon  Anesthesia Plan Comments:         Anesthesia Quick Evaluation

## 2017-03-25 NOTE — Discharge Instructions (Signed)
°  Post Anesthesia Home Care Instructions  Activity: Get plenty of rest for the remainder of the day. A responsible individual must stay with you for 24 hours following the procedure.  For the next 24 hours, DO NOT: -Drive a car -Advertising copywriterperate machinery -Drink alcoholic beverages -Take any medication unless instructed by your physician -Make any legal decisions or sign important papers.  Meals: Start with liquid foods such as gelatin or soup. Progress to regular foods as tolerated. Avoid greasy, spicy, heavy foods. If nausea and/or vomiting occur, drink only clear liquids until the nausea and/or vomiting subsides. Call your physician if vomiting continues.  Special Instructions/Symptoms: Your throat may feel dry or sore from the anesthesia or the breathing tube placed in your throat during surgery. If this causes discomfort, gargle with warm salt water. The discomfort should disappear within 24 hours.  Do not take any nonsteroidal anti inflammatories (Advil, Aleve, Ibuprofen, Motrin) until after 4:15pm today

## 2017-03-26 ENCOUNTER — Encounter (HOSPITAL_BASED_OUTPATIENT_CLINIC_OR_DEPARTMENT_OTHER): Payer: Self-pay | Admitting: Urology

## 2018-05-06 ENCOUNTER — Other Ambulatory Visit: Payer: Self-pay | Admitting: Cardiology

## 2018-09-01 ENCOUNTER — Telehealth: Payer: Self-pay

## 2018-09-01 DIAGNOSIS — I10 Essential (primary) hypertension: Secondary | ICD-10-CM

## 2018-09-01 NOTE — Telephone Encounter (Signed)
Pt called and asked if you had a chance to review the Xrays he dropped off.

## 2018-09-03 NOTE — Telephone Encounter (Signed)
Patient called me about an x-ray of the cervical and lumbar spine that was performed and the chiropractor mentioned to take x-ray to his physician as she felt that the "artery" was enlarged.  I reviewed the x-ray of the cervical spine and lumbar spine, except for aortic knuckle that is seen in cervical spine I do not see any abnormality.  I do not see mediastinal enlargement although it is not a thoracic x-ray.  Patient essentially asymptomatic, has been exercising regularly, has gotten his diabetes under control and states that he has been eating regularly and is also had good control of blood pressure.  I reassured him, advised him that from cardiac standpoint I do not see any abnormality although it is a very limited cervical spine x-ray that is showing an aortic knuckle but even that does not seem enlarged to me and I did advise that if he is very concerned he can certainly obtain a chest x-ray.  For me I would say primary prevention strategy for now.  If he has any chest pain or shortness of breath he can certainly contact us.  This is a 15-minute telephone encounter visit.

## 2019-08-04 IMAGING — CT CT PELVIS W/ CM
2 of 3 series · 16 of 46 positions shown, 18 images · IV contrast (APPLIED)
Comparison: None.

CLINICAL DATA: Palpable mass in the subcutaneous perirectal
tissues, evaluate for perirectal abscess. Current history of
diabetes.

EXAM:
CT PELVIS WITH CONTRAST
TECHNIQUE: Multidetector CT imaging of the pelvis was performed using the
standard protocol following the bolus administration of intravenous
contrast.
CONTRAST:  60mL [id number] IOPAMIDOL INJECTION 61% IV. Oral contrast
was also administered.

[Series 2: routine pelvis w/cm · axial · 0.71mm/px · z∈[+290,+545]mm · 13 of 59 slices shown, 15 images]
[im 4/59  soft-tissue]
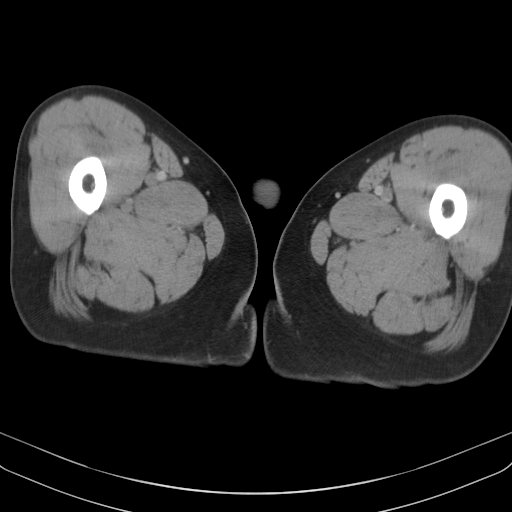
[im 4/59  bone]
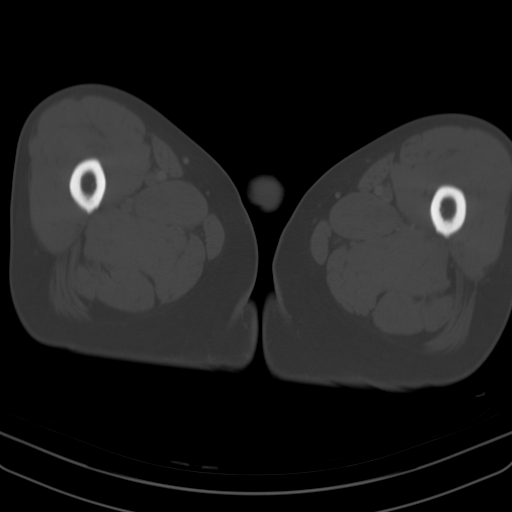
[im 8/59  soft-tissue]
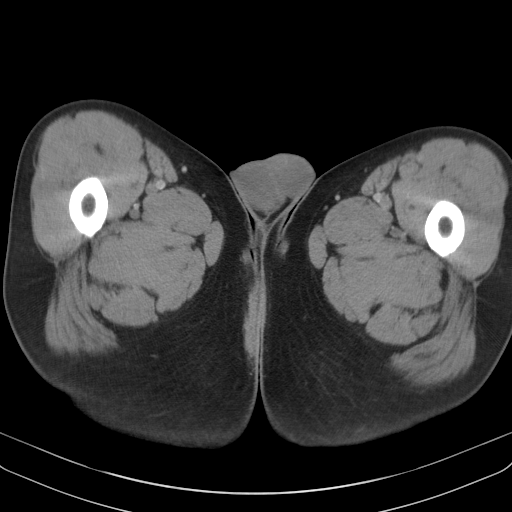
[im 12/59  soft-tissue]
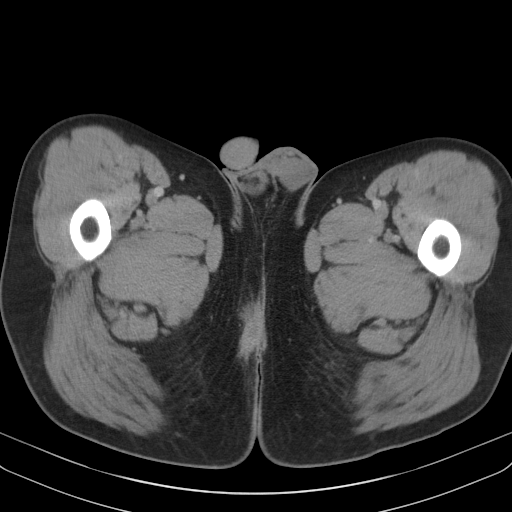
[im 17/59  soft-tissue]
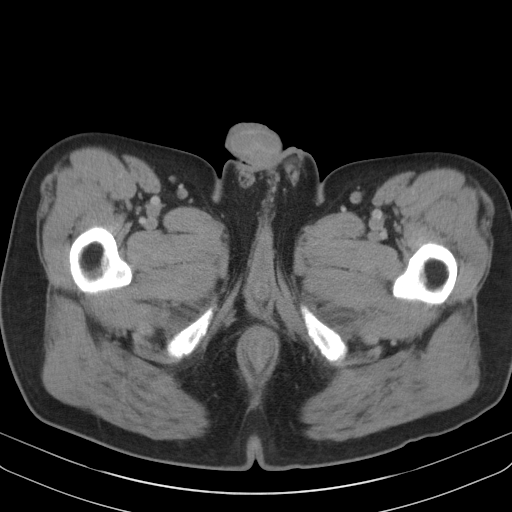
[im 21/59  soft-tissue]
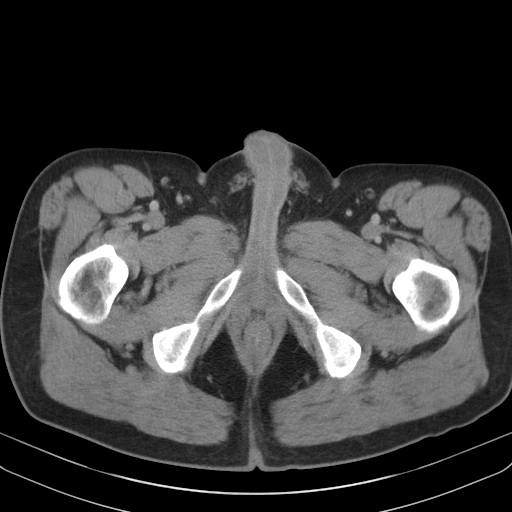
[im 25/59  soft-tissue]
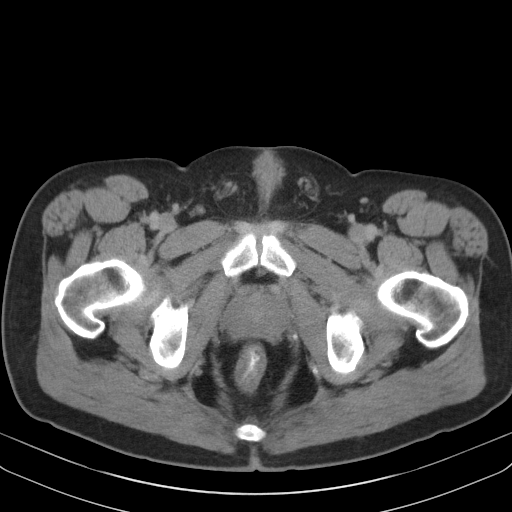
[im 30/59  soft-tissue]
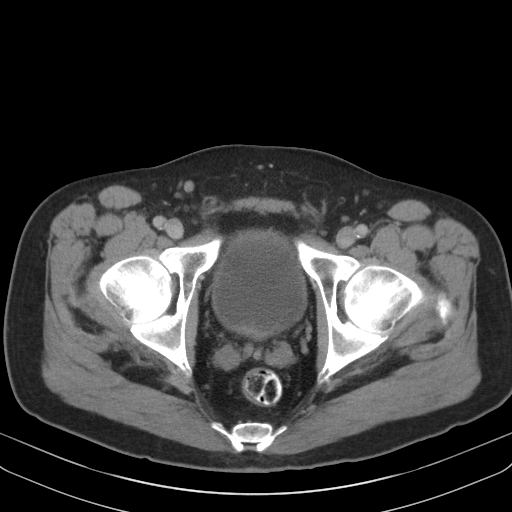
[im 34/59  soft-tissue]
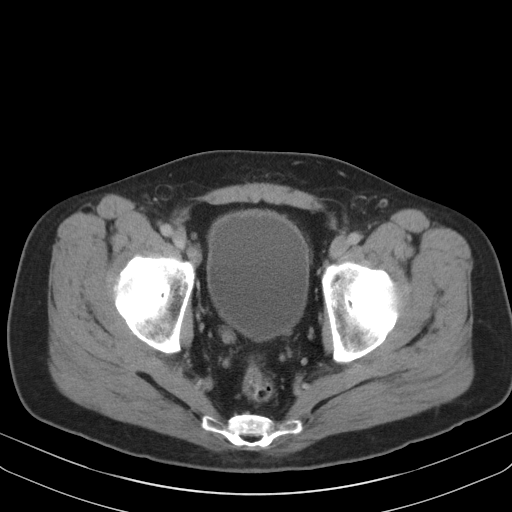
[im 38/59  soft-tissue]
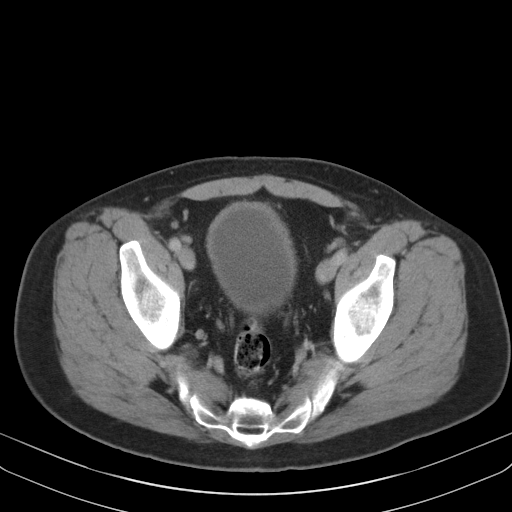
[im 38/59  bone]
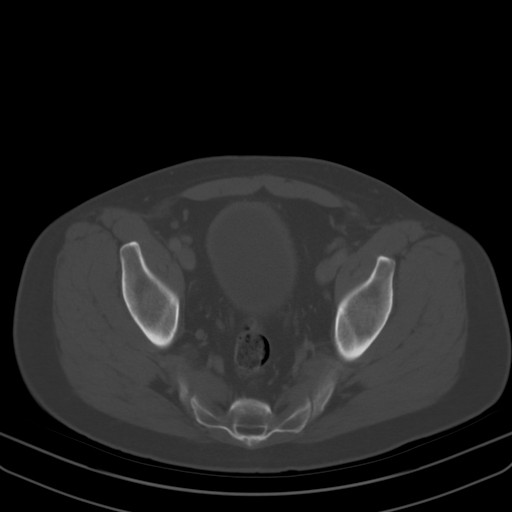
[im 42/59  soft-tissue]
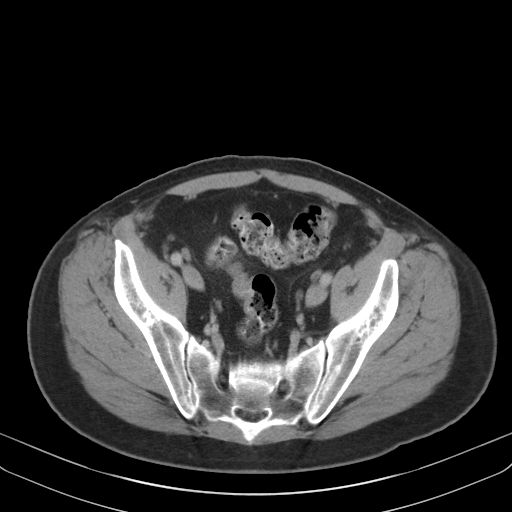
[im 47/59  soft-tissue]
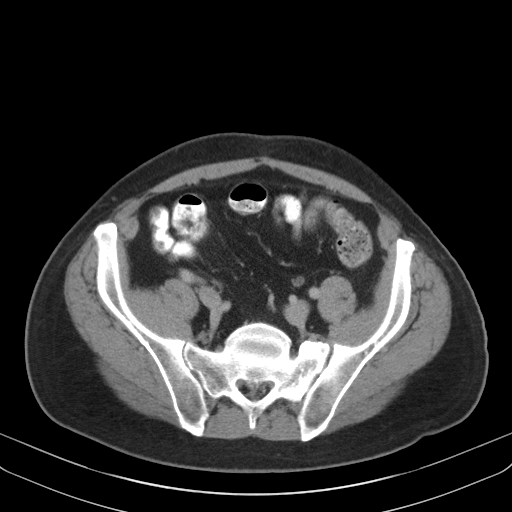
[im 51/59  soft-tissue]
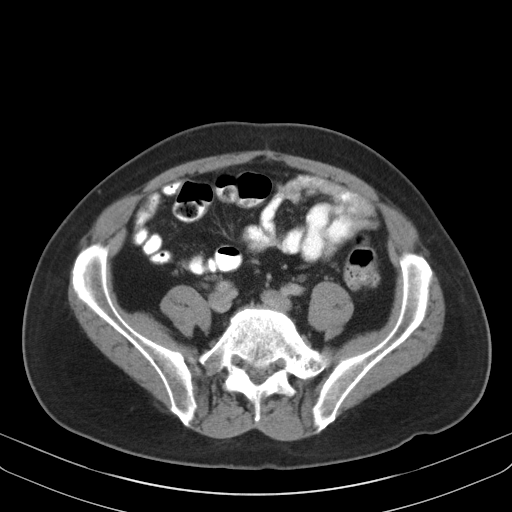
[im 55/59  soft-tissue]
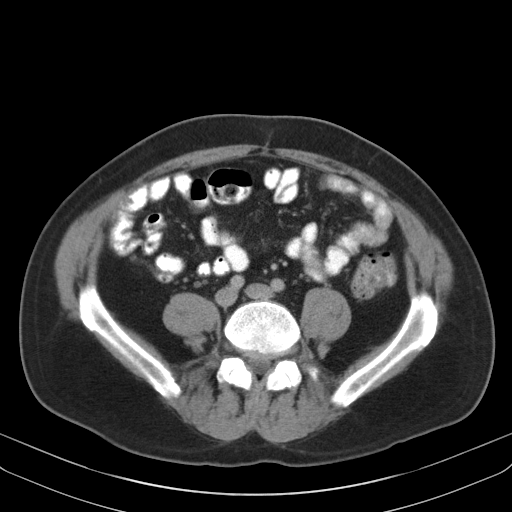

[Series 4: cor · coronal · 0.58mm/px · 3 of 80 slices shown]
[im 27/80  soft-tissue]
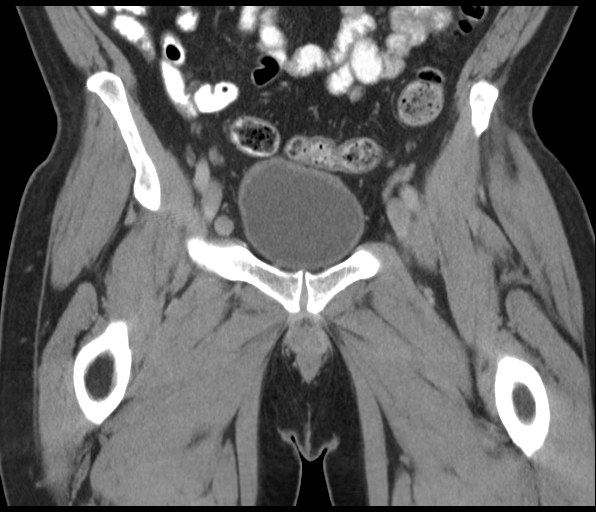
[im 36/80  soft-tissue]
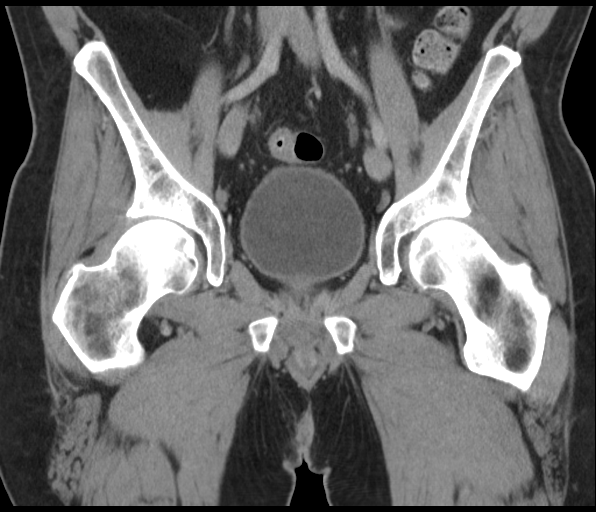
[im 44/80  soft-tissue]
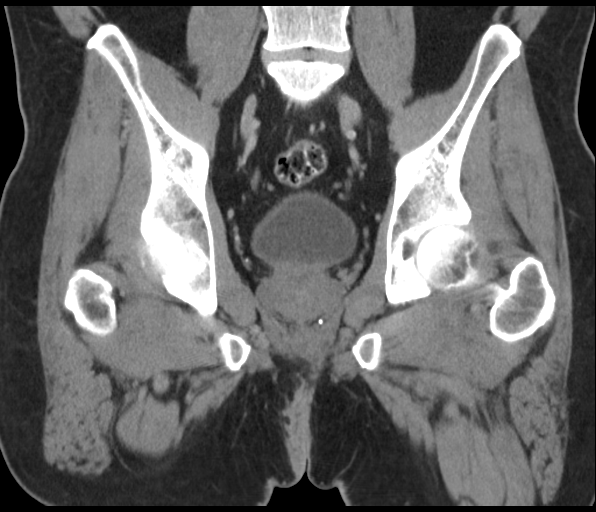

[16 of 46 positions shown; findings below may reference images not displayed]

FINDINGS: Urinary Tract: Diverticulum involving the posterolateral wall of the
urinary bladder at or near the right ureteral orifice. Asymmetric
wall thickening is present in the right posterolateral bladder wall
as well. No evidence of right ureteral dilation to suggest
obstruction. Urinary bladder otherwise unremarkable.

Bowel: Visualized small bowel and colon normal in appearance. Normal
appendix in the right upper pelvis.

Vascular/Lymphatic: Mild bilateral common femoral artery
atherosclerosis. Scattered normal sized retroperitoneal lymph nodes,
but no pathologic lymphadenopathy.

Reproductive: Mild median lobe prostate gland enlargement extending
to the base of the urinary bladder, contiguous with the focal wall
thickening. Normal seminal vesicles.

Other: Induration involving the right gluteal fold beginning at the
anus and extending inferiorly, within which is a small fluid
collection measuring approximately 1.5 x 0.6 x 3.5 cm.

Musculoskeletal: Transitional lumbosacral segment with ankylosis of
the assimilation joint between the left transverse process of this
segment and the first sacral ala. Degenerative disc disease at L4-5
and L5-S1 with central and left paracentral disc protrusion at L4-5.
IMPRESSION: 1. Abscess involving the subcutaneous tissues of the right gluteal
fold surrounded by indurated tissues. The abscess component is
measured above.
2. Asymmetric wall thickening involving the right posterolateral
wall of the urinary bladder near the right ureteral orifice were
there is also a bladder diverticulum. There is no right ureteral
dilation to suggest obstruction. Urology consultation and possible
cystoscopy is suggested in order to exclude a bladder tumor.
3. Median lobe prostate gland enlargement, contiguous with the
bladder wall thickening.
4. Mild bilateral common femoral artery atherosclerosis.
These results will be called to the ordering clinician or
representative by the Radiologist Assistant, and communication
documented in the PACS or zVision Dashboard.

## 2019-10-20 ENCOUNTER — Other Ambulatory Visit: Payer: Self-pay | Admitting: Family Medicine

## 2019-10-20 LAB — CBC
Baso # K/uL: 0.03 10*3/uL (ref 0.00–0.10)
Basophil %: 0.4 % (ref 0–2)
Eos # K/uL: 0.22 10*3/uL (ref 0.00–0.70)
Eosinophil %: 2.8 % (ref 0–7)
Hematocrit: 42.7 % (ref 39.0–51.0)
Hemoglobin: 14.4 g/dL (ref 14.0–18.0)
IMM Granulocytes #: 0.03 10*3/uL (ref 0.00–0.30)
IMM Granulocytes: 0.4 % (ref 0.0–2.0)
Lymph # K/uL: 3.12 10*3/uL — ABNORMAL HIGH (ref 0.80–2.80)
Lymphocyte %: 39.6 % (ref 12–44)
MCH: 30.2 pg (ref 26.0–33.0)
MCHC: 33.7 g/dL (ref 31.0–36.0)
MCV: 90 fL (ref 80–94)
Mean Platelet Volume: 10.4 fL (ref 8.6–11.7)
Mono # K/uL: 0.86 10*3/uL (ref 0.00–1.00)
Monocyte %: 10.9 % — ABNORMAL HIGH (ref 0–10)
Neut # K/uL: 3.61 10*3/uL (ref 1.50–7.10)
Nucl RBC # K/uL: 0 10*3/uL (ref 0.000–0.012)
Nucl RBC %: 0 /100 WBC (ref 0.0–0.2)
Platelets: 212 10*3/uL (ref 150–400)
RBC Distribution Width-SD: 38 fL (ref 35.1–43.9)
RBC: 4.77 10*6/uL (ref 4.60–6.20)
RDW: 11.8 % (ref 11.6–14.4)
Seg Neut %: 45.9 % — ABNORMAL LOW (ref 47–76)
WBC: 7.9 10*3/uL (ref 4.3–11.0)

## 2019-10-20 LAB — URINALYSIS WITH REFLEX TO MICROSCOPIC
Bilirubin,Ur: NEGATIVE
Blood,UA: NEGATIVE
Glucose,UA: >=1000 mg/dL — ABNORMAL HIGH
Ketones, UA: NEGATIVE mg/dL
Leuk Esterase,UA: NEGATIVE
Nitrite,UA: NEGATIVE
Protein,UA: NEGATIVE mg/dL
Specific Gravity,UA: 1.015 (ref 1.001–1.035)
Urobilinogen,UA: 1 EU/dL (ref <=1.0)
pH,UA: 6 (ref 5.0–6.5)

## 2019-10-20 LAB — LIPID PANEL
Chol/HDL Ratio: 2.3
Cholesterol: 104 mg/dL (ref <240)
HDL: 45 mg/dL (ref 40.0–60.0)
LDL Calculated: 42 ug/dL (ref 0–129)
Triglycerides: 83 mg/dL (ref <150)

## 2019-10-20 LAB — COMPREHENSIVE METABOLIC PANEL
ALT: 48 U/L (ref 21–72)
AST: 37 U/L (ref 17–59)
Albumin: 4.5 g/dL (ref 3.5–5.0)
Alk Phos: 62 U/L (ref 38–126)
Anion Gap: 2 — ABNORMAL LOW (ref 3–12)
Bilirubin,Total: 0.9 mg/dL (ref 0.2–1.3)
CO2: 34 mmol/L — ABNORMAL HIGH (ref 22–30)
Calcium: 9.5 mg/dL (ref 8.4–10.2)
Chloride: 99 mmol/L (ref 98–107)
Creatinine: 0.9 mg/dL (ref 0.80–1.50)
GFR,Caucasian: 94 (ref >60)
Glucose: 120 mg/dL — ABNORMAL HIGH (ref 74–106)
Lab: 18 mg/dL (ref 9–20)
Potassium: 4.1 mmol/L (ref 3.6–5.0)
Sodium: 135 mmol/L — ABNORMAL LOW (ref 137–145)
Total Protein: 7 g/dL (ref 6.3–8.2)

## 2019-10-20 LAB — HIV 1&2 ANTIGEN/ANTIBODY: HIV 1&2 ANTIGEN/ANTIBODY: NEGATIVE

## 2019-10-20 LAB — MICROALBUMIN, URINE, RANDOM
Creatinine,UR: 85.9 mg/dL
Microalb/Creat Ratio: 17 mg/g (ref 0–30)
Microalbumin,UR: 1.5 mg/dL (ref <1.7)

## 2019-10-20 LAB — HEMOGLOBIN A1C: Hemoglobin A1C: 9.3 % — ABNORMAL HIGH (ref <6.0)

## 2019-10-21 ENCOUNTER — Encounter: Payer: Self-pay | Admitting: Gastroenterology

## 2019-10-28 ENCOUNTER — Telehealth: Payer: Self-pay

## 2019-10-28 NOTE — Telephone Encounter (Signed)
NA

## 2019-10-28 NOTE — Telephone Encounter (Signed)
Ref by:  Wray Kearns  [phone]    DM    Please review and advise

## 2019-11-25 ENCOUNTER — Ambulatory Visit: Admitting: Student in an Organized Health Care Education/Training Program

## 2019-11-25 ENCOUNTER — Encounter: Payer: Self-pay | Admitting: Student in an Organized Health Care Education/Training Program

## 2019-11-25 VITALS — BP 122/59 | HR 100 | Ht 68.0 in | Wt 174.0 lb

## 2019-11-25 DIAGNOSIS — E10649 Type 1 diabetes mellitus with hypoglycemia without coma: Secondary | ICD-10-CM

## 2019-11-25 NOTE — Progress Notes (Addendum)
Division of Endocrinology, Diabetes and [facility], 3rd Floor  [address] [place], [place] [address]  Phone: [phone]    [facility] Medicine Endocrinology Outpatient Consultation Note   Tyler Jenkins (MRN: [id number])  11/25/2019  Consult requested by: Dr. Laurine Blazer    Reason for Visit:   Tyler Jenkins is a 54 y.o. year old male who is here for consultation of: Type 1 diabetes PCP: Saverio Danker    History of Present Illness:   Tyler Jenkins is a 54 y/o M with PMH significant for DM1 c/b b/l retinopathy and neuropathy, HTN who presents today to establish care for diabetes management..     Diabetes:  - Patient states he is on the following regimen: midnight - 6AM 1.8U/hr then 1.4 until noon them 1.6u/hr noon to midnight. On medtronic review, patient is ACTUALLY currently on:    - 1.35u/hr at midnight - 7AM   - 1.6u/hr from 7AM - 8PM   - 1.35u/hr from 8PM - midnight  - Carb ratio 8carbs: 1Unit right now  - Patient has been carb counting for many years, has been a little more lax lately. Has kind of fallen off the wagon. Eats mostly in the evening.   - No changes in his carb ratio in a while. Changed the basal rates 1 year ago.   - Worst low BG episode was 1 year ago, almost had a car accident, pulled over, he passed out. Son called his wife and brought Orange juice. Has not had any more episodes wince then. Will have lows below <50 and then he treats. Usually happens in the afternoons if dont eat lunch. He doesn't eat breakfast normally, just coffee. If sensor on automatic. Now that he has been on the sensor more, he wakes up lower but then if don't eat lunch then in trouble in the afternoon. Once a week goes low. He will have confusion when he goes low and taps his foot.    - Hx retinopathy b/l, s/p laser surgery 4-5 years ago. Has an eye dr here, saw them 2 months ago  - Neuropathy numbness in Right big toe, not very noticeable, helped with gabapentin   - no n/v, diarrhea, constip, no  gastroparesis  - NO changes in urination or thirst  - The pump is good and the sensor is bad. Waiting for warranty to be up to get the dexcom.   - Uses the [place] sensor now. Uses that to use his sugars and talk to his pump  - States on 10/18/19 was HbA1C was 10.1, usually runs around 8. Mostly because the summer was bad, sensors would fall off in the heat    Review of Systems:   Constitutional: Normal appetite. No fevers, night sweats, or weight loss  ?HEENT: No diplopia, blurred vision or issues with peripheral vision. No headache  Cardiovascular: No chest pain, palpitations, or edema  Respiratory:  No cough, wheezing, or shortness of breath  Gastrointestinal: No nausea, vomiting or diarrhea  Genitourinary:  No dysuria or polyuria  Musculoskeletal No joint aches  Integumentary: No change in skin, hair or nails  Neurologic: No weakness, loss of sensation or confusion  Psychiatric:  No disorder of thought or mood  Hematological: No easy bruising or bleeding    Past Medical History:   PMH  - DM1 c/b b/l retinopathy and neuropathy  - HTN  - Area of cardiac ischemia s/p LHC with no stenting, 50% blockage of a vessel he does not know  Past Surgical History:   PSH:  - Tonsillectomy at age 68  - Wrist reconstruction  - Frozen shoulder surger  - LHC on 2017    Family History:   Additional family history for thyroid, pancreatic, adrenal and pituitary were negative. Family history of cardiovascular disease, sudden cardiac death, stroke and lipid disorders are negative.  FHx:  - Grandfather with DM2  - no other autoimmune conditions  - no FHx cancer or heart disease  - Grandfather with parkinsone,   - Dad and grandmother with essential tremor    Social History:     Social Hx  - Worked as a Pharmacist, hospital, not currently working. Just moved from [place] to [place] so that his father-in-law (who lives with them) can be back near his home/ family in his last few years. Lives at home with wife and 14-yr-old  son.    Allergies:   Allergies were reviewed and confirmed in the Allergies tab of eRecord.  Not on File    Medications:   Medications were reviewed and confirmed in the Medications tab of eRecord. Endocrine related medications significant to our visit are outlined below.    Current Outpatient Medications on File Prior to Visit   Medication Sig Dispense Refill    rosuvastatin (CRESTOR) 40 mg tablet Take 20 mg by mouth daily      carvedilol (COREG) 6.25 mg tablet Take 6.25 mg by mouth 2 times daily (with meals)      losartan (COZAAR) 25 mg tablet Take 25 mg by mouth daily      insulin lispro 100 UNIT/ML injection vial Inject into the skin 3 times daily (before meals)  Maximum   Units/day. Discard vial 28 days after first use.      gabapentin (NEURONTIN) 300 MG capsule Take 300 mg by mouth daily as needed      tadalafil (CIALIS) 20 MG tablet Take 20 mg by mouth daily as needed for Erectile Dysfunction  Take at least 30 minutes prior to sexual activity.      aspirin EC (ASPIRIN EC) 81 mg EC tablet Take 81 mg by mouth daily      cholecalciferol (VITAMIN D) 50 MCG (2000 UT) tablet Take 2,000 units by mouth daily      zinc sulfate (ZINCATE) 220 (50 Zn) MG capsule Take 220 mg by mouth daily       No current facility-administered medications on file prior to visit.         Objective:   Vitals: There were no vitals filed for this visit.  Wt Readings from Last 3 Encounters:   No data found for Wt     Physical Exam:  Constitutional: No acute distress, well developed.  Eyes: Fields intact to confrontation. EOMI. There was no new vessel formation appreciated.  Neck: No supraclavicular or dorsal fat pad prominence. No carotid bruit.  Lymphatic: No auricular, mental, mandibular, cervical, or supraclavicular LAD  Cardiovascular: Regular rate and rhythm, no murmurs rubs or gallops. Distal pulses 2+. No edema.  Respiratory: Clear to auscultation bilaterally.  Gastrointestinal: Normal bowel sounds. Soft, nontender,  nondistended.  Neurologic: Alert and oriented to conversation. DTRs 2+ and equal bilaterally. Normal proximal muscle strength without muscle weakness or wasting. Normal sensation appreciated with 10 gram monofilament testing  Psychiatric: Normal mood and affect.  Skin: No acanthosis or striae.   Feet: No wounds, sores or scabbing appreciate. Toenails are intact.    Results including Laboratory data and Imaging:   Laboratory data and imaging  were reviewed. Pertinent endocrine related information is outlined below, please see eRecord for additional details.  Lab Results   Component Value Date/Time    NA 135 (L) 10/20/2019 01:35 PM    K 4.1 10/20/2019 01:35 PM    CL 99 10/20/2019 01:35 PM    UN 18 10/20/2019 01:35 PM    CREAT 0.90 10/20/2019 01:35 PM    MALBR 1.5 10/20/2019 01:35 PM     Lab Results   Component Value Date/Time    CHOL 104 10/20/2019 01:35 PM    TRIG 83 10/20/2019 01:35 PM    HDL 45.0 10/20/2019 01:35 PM    LDLC 42 10/20/2019 01:35 PM     Lab Results   Component Value Date/Time    HA1C 9.3 (H) 10/20/2019 01:35 PM     No results found for: TSH, T3F, FT4    Assessment:   Tyler Jenkins is a 54 y.o. year old male that presented today for diabetes management. Tyler Jenkins has type 1 diabetes complicated by retinopathy and neuropathy on insulin pump currently under poor control as evidenced by the most recent HbA1c of 9.3. Patient with episodes of hypoglycemia during the day likely 2/2 too much basal insulin during the day and too little meal coverage so will re-adjust insulin dosing and re-assess in 3 months for goal HbA1C around 7-7.5.     Recommendations:     - Decrease daytime basal insulin rate to 1.45 units/hr from 1.6 units/hr from 7AM to 8PM  - Continue nighttime basal insulin rate 1.35 units/hr from 8PM to 7AM  - Increase carb ratio to 7 carbs: 1unit lispro from prior 8carbs:1unit lispro    Tyler Jenkins was advised to return to clinic in 3 months.    Tyler Silversmith, MD  Internal Medicine PGY-3  PICC  [phone]  12:08 PM    Attending attestation:     I saw and evaluated the patient with Dr. Barbara Cower. I agree with the resident's findings and plan of care as documented above.  Patient was given time to ask questions and they were answered to the best of my ability.We have given the patient written instructions and he verbalized understanding of the plan.    Wilma Flavin, MD  Endocrinology      Division of Endocrinology, Diabetes & [facility], 3rd Floor  [address], [place], [place] [address]  Phone: [phone]

## 2019-11-25 NOTE — Patient Instructions (Signed)
-   Decrease daytime basal insulin dose to 1.45 during the day  - Change carb ratio to 7 cabs: 1 unit  - Have your endocrinologist send [place] your last note

## 2020-03-08 ENCOUNTER — Telehealth: Payer: Self-pay

## 2020-03-08 NOTE — Telephone Encounter (Signed)
Copied from CRM #[id number]. Topic: Appointments - Reschedule Appointment  >> Mar 08, 2020 10:27 AM Juel Burrow wrote:  Patient cancelled appointment with Darci Current and needs to be rescheduled with provider. Writer didn't see any available slots.      Patient can be reached at [phone]

## 2020-03-09 ENCOUNTER — Ambulatory Visit: Admitting: Student in an Organized Health Care Education/Training Program

## 2020-05-03 NOTE — Progress Notes (Addendum)
Division of Endocrinology, Diabetes and [facility], 3rd Floor  [address], [place], [place] [address]  Phone: [phone]    [facility] Medicine Endocrinology Outpatient Consultation Note   Tyler Jenkins (MRN: [id number])  05/09/2020  Consult requested by: Dr. Laurine Blazer    Reason for Visit:   Tyler Jenkins is a 55 y.o. year old male who is here for consultation of: Type 1 diabetes PCP: Saverio Danker    History of Present Illness:   Tyler Jenkins is a 55 y/o M with PMH significant for DM1 c/b b/l retinopathy and neuropathy, HTN who presents today to follow-up for diabetes management..     He states that he has gotten better about using his sensor.  Usually wears it for about a week and takes it off for 1 week.  He was dieting in January to help lose weight, was eating a high-protein diet and decreased his carb intake a lot so was having a lot of low blood sugars.  He did adjust his insulin pump but was unable to get it just right.  Feels that he does not really want to maintain this kind of diet so is okay with keeping his pump how it is right now.  His last hemoglobin A1c was 9.3 back in September 2021.  Currently, has been having a hard time wearing his sensor.  States it is "a pain in the butt" and that the alarms will go off in the middle of the night which is extremely annoying.  His BG's were going low every day while he was on the diet but he stopped that and now he is having low blood sugars about once a week.  He does recognize when his blood sugar goes low and will have a snack.  - He doesn't like wearing the sensor, inconsistent, pain in the vutt, alarming in the middle of the night.  - AM BG around 150 from 3AM dose, around 100 right now. Doesn't eat much for breakfast, at night he tends to go crazienr and  It goes up.   - No passing out  - 1.30 units/hr hr from midnight - 7AM  - 1.35 units/hr from 7am - 8pm  - 1.3 units/hr from 8pm-midnight  - insulin sensitivity is 20  - Carb ratio 7 carbs,  1 unit right now  - Remembering to check the pump and give a dose is his biggest issue  - Saw an eye doctor in September  -No changes in thirst, urination, no numbness of his feet, denies any new vision changes    Review of Systems:   Constitutional: Normal appetite. No fevers, night sweats, or weight loss  ?HEENT: No diplopia, blurred vision or issues with peripheral vision. No headache  Cardiovascular: No chest pain, palpitations, or edema  Respiratory:  No cough, wheezing, or shortness of breath  Gastrointestinal: No nausea, vomiting or diarrhea  Genitourinary:  No dysuria or polyuria  Musculoskeletal No joint aches  Integumentary: No change in skin, hair or nails  Neurologic: No weakness, loss of sensation or confusion  Psychiatric:  No disorder of thought or mood  Hematological: No easy bruising or bleeding    Past Medical History:   PMH  - DM1 c/b b/l retinopathy and neuropathy  - HTN  - Area of cardiac ischemia s/p LHC with no stenting, 50% blockage of a vessel he does not know  Past Surgical History:   PSH:  - Tonsillectomy at age 40  - Wrist reconstruction  -  Frozen shoulder surger  - LHC on 2017    Family History:   Additional family history for thyroid, pancreatic, adrenal and pituitary were negative. Family history of cardiovascular disease, sudden cardiac death, stroke and lipid disorders are negative.  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 Lives at home with wife and 14-yr-old son.    Allergies:   Allergies were reviewed and confirmed in the Allergies tab of eRecord.  Not on File    Medications:   Medications were reviewed and confirmed  in the Medications tab of eRecord. Endocrine related medications significant to our visit are outlined below.    Current Outpatient Medications on File Prior to Visit   Medication Sig Dispense Refill    rosuvastatin (CRESTOR) 40 mg tablet Take 20 mg by mouth daily      carvedilol (COREG) 6.25 mg tablet Take 6.25 mg by mouth 2 times daily (with meals)      losartan (COZAAR) 25 mg tablet Take 25 mg by mouth daily      insulin lispro 100 UNIT/ML injection vial Inject into the skin 3 times daily (before meals)  Maximum   Units/day. Discard vial 28 days after first use.      gabapentin (NEURONTIN) 300 MG capsule Take 300 mg by mouth daily as needed      tadalafil (CIALIS) 20 MG tablet Take 20 mg by mouth daily as needed for Erectile Dysfunction  Take at least 30 minutes prior to sexual activity.      aspirin EC (ASPIRIN EC) 81 mg EC tablet Take 81 mg by mouth daily      cholecalciferol (VITAMIN D) 50 MCG (2000 UT) tablet Take 2,000 units by mouth daily      zinc sulfate (ZINCATE) 220 (50 Zn) MG capsule Take 220 mg by mouth daily       No current facility-administered medications on file prior to visit.         Objective:   Vitals:   Vitals:    05/04/20 1303   BP: 121/70   Pulse: 74   Weight: 80.1 kg (176 lb 8 oz)   Height: 1.727 m (5\' 8" )     Wt Readings from Last 3 Encounters:   05/04/20 80.1 kg (176 lb 8 oz)   11/25/19 78.9 kg (174 lb)     Physical Exam:  Constitutional: No acute distress, well developed.  Eyes: Fields intact to confrontation. EOMI. There was no new vessel formation appreciated.  Neck: No supraclavicular or dorsal fat pad prominence. No carotid bruit.  Lymphatic: No auricular, mental, mandibular, cervical, or supraclavicular LAD  Cardiovascular: Regular rate and rhythm, no murmurs rubs or gallops. No edema.  Respiratory: Clear to auscultation bilaterally.  Gastrointestinal: Normal bowel sounds. Soft, nontender, nondistended.  Neurologic: Alert and oriented to conversation. Moving all  extremities and grossly intact  Psychiatric: Normal mood and affect.  Skin: No acanthosis or striae.   Feet: No wounds, sores or scabbing appreciate.    Results including Laboratory data and Imaging:   Laboratory data and imaging were reviewed. Pertinent endocrine  related information is outlined below, please see eRecord for additional details.  POC HbA1C 8.9    Assessment:   Tyler Jenkins is a 55 y.o. year old male that presented today for diabetes management. Tyler Jenkins has type 1 diabetes complicated by retinopathy and neuropathy on insulin pump currently under poor control with HbA1C today 8.9, patient with increased postprandial BG's so will adjust his carb ratio today.    Recommendations:     - Continue current basal insulin doses  - Increase carb ratio to 6 carbs: 1unit from prior 7 carbs:1unit  - Start metformin 500mg  daily and titrate up to 1000mg  BID    Tyler Jenkins was advised to return to clinic in 3 months.    , MD  Internal Medicine PGY-3  PICC [phone]  1:56 PM          Endocrinology Attending Addendum:  I independently saw and evaluated Tyler Jenkins with Dr. [id number] and confirm their findings (history, exam, labs/imaging) and assessment/plan of care as documented. I have reviewed their note and have edited, as appropriate, with my findings along with any relevant corrections/updates.    Tyler Dupre, MD 6:58 PM 05/13/2020 Pager [phone]  [facility] Med Endocrinology

## 2020-05-04 ENCOUNTER — Ambulatory Visit
Payer: PRIVATE HEALTH INSURANCE | Attending: Endocrinology | Admitting: Student in an Organized Health Care Education/Training Program

## 2020-05-04 VITALS — BP 121/70 | HR 74 | Ht 68.0 in | Wt 176.5 lb

## 2020-05-04 DIAGNOSIS — E1065 Type 1 diabetes mellitus with hyperglycemia: Secondary | ICD-10-CM

## 2020-05-04 DIAGNOSIS — E119 Type 2 diabetes mellitus without complications: Secondary | ICD-10-CM | POA: Insufficient documentation

## 2020-05-04 LAB — POCT HEMOGLOBIN A1C: Hemoglobin A1C,POC: 8.9 % — ABNORMAL HIGH

## 2020-05-04 MED ORDER — METFORMIN HCL 500 MG PO TB24 *I*
1000.0000 mg | ORAL_TABLET | Freq: Two times a day (BID) | ORAL | 3 refills | Status: DC
Start: 2020-05-04 — End: 2023-02-16

## 2020-05-04 NOTE — Patient Instructions (Addendum)
-   Start metformin    Metformin works by decreasing the sugar production in your body, and making you more sensitive to your own insulin. It often also helps people lose weight, and in pre-diabetes, is associated with a 30% reduction in the risk of developing full blown diabetes. In this research study, young women had up to a 50% reduction. It's worth knowing that just a 7-8% body weight loss and 150 minutes of exercise a week can also reduce your risk by up to 50%. With one or both of these interventions, many people can go into remission.    Metformin is very safe, but many people get nausea, bloating, and diarrhea, especially in the first week after starting treatment. It usually gets better after a week or two. Because of this, we start a low dose and slowly ramp up, and we use the "extended release" formulation which has fewer side effects. It also helps to take it with food.  - Take 500mg  with your largest meal of the day to start. Continue this dose for one week.  - If no issues with GI upset, then increase to 500mg  twice a day with food after 1 week. If you're still having GI upset, give it another week at the 500mg  dose once a day.  - If no issues with the 500mg  twice a day for one week, then increase to 500mg  for one meal, and 1000mg  for the other meal for one week.  - If no issues after one week, increase to the final dose of 1000mg  twice a day.  If at any point the GI issues are unbearable, you can either go down to a lower dose, or call me to discuss.

## 2020-05-07 ENCOUNTER — Encounter: Payer: Self-pay | Admitting: Student in an Organized Health Care Education/Training Program

## 2020-05-18 NOTE — Progress Notes (Signed)
[facility] detailed written order for continuous glucose monitoring equipment was received, completed and is ready to be signed by the provider and faxed back to [facility] for review and processing.     Brent Xzandria Clevinger, RN

## 2020-05-23 ENCOUNTER — Encounter: Payer: Self-pay | Admitting: Adult Health

## 2020-05-23 ENCOUNTER — Telehealth: Payer: Self-pay | Admitting: Student in an Organized Health Care Education/Training Program

## 2020-05-23 ENCOUNTER — Encounter: Payer: Self-pay | Admitting: Gastroenterology

## 2020-05-23 NOTE — Telephone Encounter (Signed)
Copied from CRM #[id number]. Topic: Information Request - Contact/Fax Information  >> May 23, 2020  1:48 PM Myrtice Lauth wrote:  Johny Drilling [facility]) is calling on behalf of patient Alfonsa Vaile in regards to [facility] for CGM Supplies.    Writer advised Johny Drilling [facility] [employer]) that the request has been completed, signed and placed in bin to faxed back for processing.     Johny Drilling [facility] [employer]) can be reached if necessary at [phone]  Patient can be reached if necessary at Telephone Information:  Mobile          [phone]

## 2020-05-23 NOTE — Progress Notes (Signed)
[facility] for CGM supplies completed, signed and placed in bin to be faxed to Melvyn Neth, NP

## 2020-06-01 ENCOUNTER — Ambulatory Visit: Payer: PRIVATE HEALTH INSURANCE | Attending: Emergency Medicine | Admitting: Emergency Medicine

## 2020-06-01 VITALS — BP 98/69 | HR 107 | Temp 97.8°F | Resp 20 | Wt 167.0 lb

## 2020-06-01 DIAGNOSIS — H9209 Otalgia, unspecified ear: Secondary | ICD-10-CM | POA: Insufficient documentation

## 2020-06-01 DIAGNOSIS — Z20822 Contact with and (suspected) exposure to covid-19: Secondary | ICD-10-CM | POA: Insufficient documentation

## 2020-06-01 DIAGNOSIS — M791 Myalgia, unspecified site: Secondary | ICD-10-CM

## 2020-06-01 DIAGNOSIS — J029 Acute pharyngitis, unspecified: Secondary | ICD-10-CM | POA: Insufficient documentation

## 2020-06-01 LAB — INFLUENZA B PCR: Influenza B PCR: 0

## 2020-06-01 LAB — POCT AMBULATORY RAPID STREP: Rapid Strep Group A Throat-POC: NEGATIVE

## 2020-06-01 LAB — COVID-19 NAAT (PCR): COVID-19 NAAT (PCR): NEGATIVE

## 2020-06-01 LAB — INFLUENZA A: Influenza A PCR: 0

## 2020-06-01 LAB — RSV PCR: RSV PCR: 0

## 2020-06-02 NOTE — UC Provider Note (Signed)
History     Chief Complaint   Patient presents with    Sore Throat     SX. x 3 days    Otalgia     This pleasant male patient comes into the urgent care complaining of a sore throat which she has had for 3 days.  He has also had intermittent otalgia.  He has had no fever, chills and/or malaise.  No nausea, vomiting and/or diarrhea.  No sinus pain or pressure although he does have postnasal drip and some intermittent nasal congestion.  He is here for evaluation of his symptoms.          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           No past surgical history on file.  No family history on file.       Social History     Tobacco Use    Smoking status: Not on file    Smokeless tobacco: Not on file   Substance Use Topics    Alcohol use: Not on file    Drug use: Not on file     Living Situation     Questions Responses    Patient lives with     Homeless     Caregiver for other family member     External Services     Employment     Domestic Violence Risk                 Review of Systems   Review of Systems   All other systems reviewed and are negative.      Physical Exam   Vitals      First Recorded BP: 98/69, Resp: 20, Temp: 36.6 C (97.8 F) Oxygen Therapy SpO2: 97 %, Heart Rate: 107, (06/01/20 1025)  .      Physical Exam  Vitals and nursing note reviewed.   Constitutional:       Comments: On physical exam, this is a well-developed, well-nourished male who is in no acute distress.  He is sitting comfortably on the exam room chair.  HEENT: Normocephalic, atraumatic.  Pharynx is injected and swollen however there is no exudate visualized.  There is submandibular lymphadenopathy noted.  Examination of the ears: There is no erythema or swelling of the canal and no injection of the tympanic membrane however there is  bulging of the TMs.  Lungs are clear to auscultation bilaterally with no wheezes appreciated.  Skin is warm and dry with no lesions or rashes appreciated.          Medical Decision Making   Medical Decision Making  Assessment:    Sore throat, otalgia    Plan and Results:    New Prescriptions  No medications on file    Labs Reviewed  STREP A CULTURE, THROAT         Narrative: No growth to date  COVID-19 PCR    I had a discussion with this patient regarding his symptoms.  We discussed various differentials.  He was tested for COVID and will be contacted in the event that he is positive.  His rapid strep was negative but will be sent out for culture and he will also be  contacted if that is positive.  We discussed various OTC medications that may benefit him in relieving his symptoms.  In the event that he develops any concerning symptoms, he will follow-up with his PCP.            Final Diagnosis    ICD-10-CM ICD-9-CM   1. Sore throat  J02.9 462   2. Myalgia  M79.10 729.1         Sherron Ales, [place]

## 2020-06-03 LAB — STREP A CULTURE, THROAT: Group A Strep Throat Culture: 0

## 2020-06-08 DIAGNOSIS — I10 Essential (primary) hypertension: Secondary | ICD-10-CM | POA: Insufficient documentation

## 2020-06-29 ENCOUNTER — Ambulatory Visit: Payer: PRIVATE HEALTH INSURANCE | Admitting: Student in an Organized Health Care Education/Training Program

## 2020-06-29 DIAGNOSIS — E1065 Type 1 diabetes mellitus with hyperglycemia: Secondary | ICD-10-CM

## 2020-06-29 MED ORDER — INSULIN LISPRO (HUMAN) 100 UNIT/ML IJ/SC SOLN *WRAPPED*
20.0000 [IU] | Freq: Three times a day (TID) | SUBCUTANEOUS | 0 refills | Status: AC
Start: 2020-06-29 — End: 2020-10-31

## 2020-06-29 NOTE — Progress Notes (Addendum)
[facility], 3rd Floor  [address] [place], [place] [address]  Phone: [phone]    [facility] Medicine Endocrinology Outpatient Consultation Note   Salathiel Ferrara (MRN: [id number])  06/29/2020  Consult requested by: Dr. Laurine Blazer    Reason for Visit:   Arkeem Harts is a 55 y.o. year old male who is here for consultation of: Type 1 diabetes PCP: Saverio Danker    History of Present Illness:   Mr. Neiko Trivedi is a 54 y/o M with PMH significant for DM1 c/b b/l retinopathy and neuropathy, HTN who presents today to follow-up for diabetes management.    DM1  - Goes low more often when he has the sensor on, using the sensor every other week  - Hypoglycemia happening every day, doesn't eat in the AM, gets engrossed in work  - [employer] tells him when he is low  - BG's will go down to around 40 and then he knows he has to eat  - Hypoglycemia sx: Foot twitching, his vision is affected, no LOC, no confusion  - Wakes up in the middle of the night with a low, that has not happened in a few weeks  - never low when not on the sensor, BG's tend to be high, in the 200's  - not on the extreme diet anymore  - Started the metformin last visit, stomach bothers him a lot, having stomach cramps for 1-1.5 hrs after taking the meds, worse in the in evening with food. Experiences GI sx on the first day and then constipated the rest of the. Diarrhea and constipation not as much now  - was sick with a virus recently, was really bad, got augmentin for resp inf too, COVID neg x3  - No changes in basal rate:   - 1.30 units/hr hr from midnight - 7AM   - 1.35 units/hr from 7am - 8pm   - 1.3 units/hr from 8pm-midnight   - Carb ratio 6 carbs, 1 unit right now (changed last visit)  -No changes in thirst, urination, no numbness of his feet, denies any new vision changes    Review of Systems:   Constitutional: Normal appetite. No fevers, night sweats, or weight loss  ?HEENT: No diplopia, blurred vision or  issues with peripheral vision. No headache  Cardiovascular: No chest pain, palpitations, or edema  Respiratory:  No cough, wheezing, or shortness of breath  Gastrointestinal: No nausea, vomiting or diarrhea  Genitourinary:  No dysuria or polyuria  Musculoskeletal No joint aches  Integumentary: No change in skin, hair or nails  Neurologic: No weakness, loss of sensation or confusion  Psychiatric:  No disorder of thought or mood  Hematological: No easy bruising or bleeding    Past Medical History:   PMH  - DM1 c/b b/l retinopathy and neuropathy  - HTN  - Area of cardiac ischemia s/p LHC with no stenting, 50% blockage of a vessel he does not know  Past Surgical History:   PSH:  - Tonsillectomy at age 68  - Wrist reconstruction  - Frozen shoulder surger  - LHC on 2017    Family History:   Additional family history for thyroid, pancreatic, adrenal and pituitary were negative. Family history of cardiovascular disease, sudden cardiac death, stroke and lipid disorders are negative.  FHx:  - Grandfather with DM2  - no other autoimmune conditions  - no FHx cancer or heart disease  - Grandfather with parkinsone,   - Dad and  grandmother with essential tremor    Social History:     Social Hx  - Worked as a Pharmacist, hospital, not currently working. Just moved from [place] to [place] so that his father-in-law (who lives with them) can be back near his home/ family in his last few years. Lives at home with wife and 14-yr-old son.    Allergies:   Allergies were reviewed and confirmed in the Allergies tab of eRecord.  No Known Allergies (drug, envir, food or latex)    Medications:   Medications were reviewed and confirmed in the Medications tab of eRecord. Endocrine related medications significant to our visit are outlined below.    Current Outpatient Medications on File Prior to Visit   Medication Sig Dispense Refill    metFORMIN (GLUCOPHAGE-XR) 500 mg 24 hr tablet Take 2 tablets (1,000 mg total) by mouth 2 times daily (with  meals)  Swallow whole. Do not crush, break, or chew. 360 tablet 3    rosuvastatin (CRESTOR) 40 mg tablet Take 20 mg by mouth daily      carvedilol (COREG) 6.25 mg tablet Take 6.25 mg by mouth 2 times daily (with meals)      losartan (COZAAR) 25 mg tablet Take 25 mg by mouth daily      insulin lispro 100 UNIT/ML injection vial Inject into the skin 3 times daily (before meals)  Maximum   Units/day. Discard vial 28 days after first use.      gabapentin (NEURONTIN) 300 MG capsule Take 300 mg by mouth daily as needed      tadalafil (CIALIS) 20 MG tablet Take 20 mg by mouth daily as needed for Erectile Dysfunction  Take at least 30 minutes prior to sexual activity.      aspirin EC (ASPIRIN EC) 81 mg EC tablet Take 81 mg by mouth daily      cholecalciferol (VITAMIN D) 50 MCG (2000 UT) tablet Take 2,000 units by mouth daily      zinc sulfate (ZINCATE) 220 (50 Zn) MG capsule Take 220 mg by mouth daily       No current facility-administered medications on file prior to visit.         Objective:   Vitals:   There were no vitals filed for this visit.  Wt Readings from Last 3 Encounters:   06/01/20 75.8 kg (167 lb)   05/04/20 80.1 kg (176 lb 8 oz)   11/25/19 78.9 kg (174 lb)     Physical Exam:  Constitutional: No acute distress, well developed.  Eyes: Fields intact to confrontation. EOMI. There was no new vessel formation appreciated.  Neck: No supraclavicular or dorsal fat pad prominence. No carotid bruit.  Lymphatic: No auricular, mental, mandibular, cervical, or supraclavicular LAD  Cardiovascular: Regular rate and rhythm, no murmurs rubs or gallops. No edema.  Respiratory: Clear to auscultation bilaterally.  Gastrointestinal: Normal bowel sounds. Soft, nontender, nondistended.  Neurologic: Alert and oriented to conversation. Moving all extremities and grossly intact  Psychiatric: Normal mood and affect.  Skin: No acanthosis or striae.   Feet: No wounds, sores or scabbing appreciate.    Results including Laboratory  data and Imaging:   Laboratory data and imaging were reviewed. Pertinent endocrine related information is outlined below, please see eRecord for additional details.  POC HbA1C 8.9 on 05/04/20    Assessment:   Irl Bodie is a 55 y.o. year old male that presented today for diabetes management. Dayson Aboud has type 1 diabetes complicated by retinopathy and neuropathy  on insulin pump currently under poor control and patient with intermittent daily hypoglycemia so will reduce daytime basal dosing and will likely need to increase carb adjustment.    Recommendations:     - Decrease basal rate to 1.30 units/hr during 7am - 8pm  - Continue other settings:   - 1.30 units/hr hr from midnight - 7AM   - 1.3 units/hr from 8pm-midnight   - Continue current carb ratio 6 carbs: 1 unit insulin    Marino Rogerson was advised to return to clinic in 3 months.    Webb Silversmith, MD  Internal Medicine PGY-3  PICC [phone]  12:37 PM     I saw and evaluated the patient. I agree with the resident's/fellow's findings and plan of care as documented above.  We will drop daytime basal rate but also important that the patient try to eat in the morning.      Minette Brine, MD,PHD

## 2020-07-03 ENCOUNTER — Encounter: Payer: Self-pay | Admitting: Student in an Organized Health Care Education/Training Program

## 2020-07-03 DIAGNOSIS — E109 Type 1 diabetes mellitus without complications: Secondary | ICD-10-CM | POA: Insufficient documentation

## 2020-09-07 LAB — UNMAPPED LAB RESULTS
Basophil # (HT): 0.1 10 3/uL (ref 0.0–0.2)
Basophil % (HT): 1 % (ref 0–2)
Eosinophil # (HT): 0.3 10 3/uL (ref 0.0–0.5)
Eosinophil % (HT): 3 % (ref 0–7)
Hematocrit (HT): 43 % (ref 40–52)
Hemoglobin (HGB) (HT): 14.6 g/dL (ref 13.0–17.5)
Lymphocyte # (HT): 2.9 10 3/uL (ref 0.9–3.8)
Lymphocyte % (HT): 35 % (ref 17–44)
MCHC (HT): 33.7 g/dL (ref 32.0–36.0)
MCV (HT): 88.2 fL (ref 81.0–99.0)
Mean Corpuscular Hemoglobin (MCH) (HT): 29.7 pg (ref 26.0–34.0)
Monocyte # (HT): 0.9 10 3/uL (ref 0.2–1.0)
Monocyte % (HT): 11 % (ref 4–12)
Neutrophil # (HT): 4.2 10 3/uL (ref 1.5–7.7)
Platelets (HT): 219 10 3/uL (ref 140–400)
RBC (HT): 4.91 10 6/uL (ref 4.20–5.90)
RDW (HT): 12.3 % (ref 11.5–15.0)
Seg Neut % (HT): 50 % (ref 40–75)
WBC (HT): 8.4 10 3/uL (ref 4.0–10.8)

## 2020-10-17 ENCOUNTER — Ambulatory Visit: Payer: PRIVATE HEALTH INSURANCE | Admitting: Endocrinology

## 2020-10-30 ENCOUNTER — Ambulatory Visit: Payer: PRIVATE HEALTH INSURANCE | Admitting: Physician Assistant

## 2020-10-30 VITALS — BP 92/64 | HR 77 | Ht 67.0 in | Wt 167.8 lb

## 2020-10-30 DIAGNOSIS — E1065 Type 1 diabetes mellitus with hyperglycemia: Secondary | ICD-10-CM

## 2020-10-30 NOTE — Patient Instructions (Signed)
-  I will send in scripts for glucagon and for insulin pens in case of pump failure  -try to enter all carbs into your pump, and bolus 10-15 minutes before your meal  -let me know if you have any more issues getting your sensors  -complete urine test

## 2020-10-30 NOTE — Progress Notes (Signed)
Subjective:      Tyler Jenkins is a 55 y.o. male who presents for follow-up of type 1 diabetes.      Last seen 06/29/20. At that time basal rates were decreased from 7AM to 8PM.     He reports that he has been having some issues with getting his libre sensors to stick at times.  He continues with use of Medtronic pump and libre sensor.He does report that he is having postprandial BG elevations.  Does not enter carbs into his pump using bolus wizard, rather will sometimes give correction dose after his meal.  He also notes that if he is not eating BG tends to trend down and into a hypoglycemic episode in the afternoon.    INTERVAL HISTORY:   --Treatment history:  -- Current management: Medtronic 670 G  -- Insulin pump settings:  -- Basal rates:   0000 - 1.30 units/hour   0700 - 1.35 units/hour   2000 -  1.30 units/hour    --TDD basal insulin: 31.850 units  -- Active insulin time: 3 hours  -- Bolus wizard use: yes for occasional correction  -- Insulin:carb ratio: 1:6 grams  -- Sensitivity: 20 mg/dL  -- Target blood glucose: 105-125 mg/dL  -- Glucose sensor: Freestyle Libre 2.      --Hyperglycemia: Yes, particularly postprandial   --Hypoglycemia: Yes, BG's trend down in the afternoon particularly when not eating. Loss of consciousness: no. Assistance needed:no.   --Endocrine: Polyuria: no. Polydipsia: no.  --CV: Chest pain: no; Shortness of breath: no;  --Extremities:  -- Hands: Tingling: no. Numbness: no. Pain: no.   -- Feet: Tingling: no. Numbness: no. Pain: no. Follows with podiatry? No, PCP checks feet  Started on gabapentin   -- Eyes: History of laser treatment: no. Double/blurry vision: no. Retinopathy: yes -  Cataracts: no. Glaucoma: no. Last eye exam: 10/2019 at [facility], aware he is due for updated exam.   -- Kidney: history of nephropathy: no.   -- Diet: Does not enter carbs into his pump, states that he knows how to carb count but has not been doing this  --Exercise: None formal  --Emergency Supplies: Does not  have glucagon or long-acting insulin at home      Patient's medications, allergies, past medical, surgical, social and family histories were reviewed and updated as appropriate.      Review of Systems  Pertinent items noted in HPI     Objective:      BP 92/64    Pulse 77    Ht 1.702 m (_0 )    Wt 76.1 kg (167 lb 12.8 oz)    BMI 26.28 kg/m     General:  alert, cooperative, and no distress   Neck: no adenopathy, supple, symmetrical, trachea midline, and thyroid not enlarged, symmetric, no tenderness/mass/nodules, no acanthosis nigricans   Thyroid:  no palpable nodule   Lung: clear to auscultation bilaterally   Heart:  regular rate and rhythm, S1, S2 normal, no murmur, click, rub or gallop   Extremities: extremities normal, atraumatic, no cyanosis or edema and bilateral feet clean and dry without callus or ulceration   Pulses: 2+ and symmetric   Neuro: mental status, speech normal, alert and oriented x3, reflexes normal and symmetric, and sensation intact bilaterally on monofilament exam      Lab Review    09/07/20 -   Contains abnormal dataComprehensive Metabolic Panel  Order: [id number]   Ref Range & Units 1 mo ago   SODIUM 136 - 145  mmol/L 140    POTASSIUM 3.5 - 5.1 mmol/L 4.3    CHLORIDE 98 - 108 mEq/L 103    CO2 20 - 31 mEq/L 29    ANION GAP 4.0 - 16.0 mEq/L 8    BUN 9 - 23 mg/dL 18    CREATININE 0.7 - 1.3 mg/dL 0.9    GLUCOSE 60 - 140 mg/dL 146High    Comment: NOTE: Random Glucose range 60-140 mg/dl     Fasting Glucose range 60-99 mg/dl   CALCIUM 8.6 - 10.2 mg/dL 9.3    TOTAL PROTEIN 5.7 - 8.2 g/dL 7.4    ALBUMIN 3.5 - 5.2 g/dL 4.7    GLOBULIN 1.6 - 3.6 2.7    BILI, TOTAL 0.3 - 1.2 mg/dL 1.2    AST 14 - 39 U/L 42High    ALT 1 - 44 U/L 57High    ALK PHOS 53 - 129 U/L 60    Resulting Agency       Contains abnormal dataLipid Panel reflex to Direct LDL if trig>400 and <1201  Order: [id number]   Ref Range & Units 1 mo ago   CHOLESTEROL 1 - 199 mg/dL 89    TRIGLYCERIDES <150 mg/dL 77    HDL CHOLESTEROL 40  - 60 mg/dL 37Low    non-HDL-C 0 - 129 mg/dL 52    CHOL/HDL RATIO  2.4    Comment: CHOLESTEROL/HDL RATIO INTERPRETATION:                  MEN    WOMEN   1/2 Average Risk       3.4     3.4   Average Risk         5.0     4.4   2 Times Average Risk     9.6     7.1   3 Times Average Risk   23.4    11.0   LDL (calc) 0 - 99 mg/dL 37    Resulting Agency  [facility]       Contains abnormal dataHemoglobin A1C  Order: [id number]   Ref Range & Units 1 mo ago   EAVERAGE GLUCOSE 68 - 126 mg/dL 220High    HEMOGLOBIN A1C 4.2 - 5.6 % 9.3High      Results for IMANUEL, PRUIETT (MRN [id number]) as of 10/31/2020 22:24   Ref. Range 10/20/2019 13:35   Creatinine,UR Latest Units: mg/dL 85.90   Microalbumin,UR Latest Ref Range: <1.7 mg/dL 1.5   Microalb/Creat Ratio Latest Ref Range: 0 - 30 mg/g 17           Assessment:          Uncontrolled type 1 diabetes mellitus. At this time, patient A1c is elevated above goal of <7%. Review of libre demonstrates 33% time in range, 12% low, remainder high.  No GMI calculated due to insufficient data.  On review there are significant gaps in data, reviewed need to scan with libre at least every 8 hours in order to prevent loss of data.  He does have consistent episodes of hypoglycemia in the afternoon, will reduce basal rate at this part of the day to adjust for this.  He also has not been bolusing for carbohydrates, and instead sometimes gives a correction bolus after he eats.  Discussed need to enter all carbohydrates into the pump and deliver all boluses 10 to 15 minutes prior to meals.  Reviewed that missed boluses is resulting in postprandial hyperglycemia. He reports he is comfortable  with how to carb count.       Plan:           1. DM:  uncontrolled.       -decrease basal rates as follows:    0000 - 1.30 units/hour   0700 - 1.25 units/hour   2000 -  1.30 units/hour       -Patient to work toward entering all carbohydrates  into the pump and bolusing for all carbs 10 to 15 minutes before meals rather than bolusing after he eats       -Reviewed need to scan with libre at least every 8 hours in order to prevent loss of data       -Patient to contact office if having any further episodes of hypoglycemia following basal rate adjustment       -Prescription sent for glucagon in case of hypoglycemia and long-acting insulin in case of insulin pump failure       -Order updated UMA to be completed soon    2. Hyperlipidemia:         -LDL at goal 08/2020,  on statin     3. HTN:          -BP at goal today, continue current regimen at this time     4. Diabetic neuropathy: None known          -Reviewed diabetic foot care and need to check feet daily for changes or development of wounds    5. Diabetic retinopathy: Yes        -last eye exam 10/2019             -Patient to schedule appointment with ophthalmology     6. Diabetic nephropathy:           -UMA/creatinine ratio 17 10/2019, on ARB         -Order updated UMA to be completed soon     7.  Follow up in 6 weeks and as needed    Thank you for allowing me to participate in the care of this patient.  Please feel free to contact me if you have any questions or concerns.

## 2020-10-31 MED ORDER — PEN NEEDLES 31G X 6 MM MISC *A*
1 refills | Status: AC
Start: 2020-10-31 — End: ?

## 2020-10-31 MED ORDER — INSULIN GLARGINE 100 UNIT/ML SC SOPN *I*
PEN_INJECTOR | SUBCUTANEOUS | 1 refills | Status: AC
Start: 2020-10-31 — End: ?

## 2020-10-31 MED ORDER — GLUCAGON 3 MG/DOSE NA POWD *I*
NASAL | 3 refills | Status: AC
Start: 2020-10-31 — End: ?

## 2020-12-07 LAB — UNMAPPED LAB RESULTS
Basophil # (HT): 0.1 10 3/uL (ref 0.0–0.2)
Basophil % (HT): 1 % (ref 0–2)
Eosinophil # (HT): 0.7 10 3/uL — ABNORMAL HIGH (ref 0.0–0.5)
Eosinophil % (HT): 6 % (ref 0–7)
Hematocrit (HT): 42 % (ref 40–52)
Hemoglobin (HGB) (HT): 14.3 g/dL (ref 13.0–17.5)
Lymphocyte # (HT): 2.9 10 3/uL (ref 0.9–3.8)
Lymphocyte % (HT): 28 % (ref 17–44)
MCHC (HT): 33.9 g/dL (ref 32.0–36.0)
MCV (HT): 89.2 fL (ref 81.0–99.0)
Mean Corpuscular Hemoglobin (MCH) (HT): 30.2 pg (ref 26.0–34.0)
Monocyte # (HT): 1.2 10 3/uL — ABNORMAL HIGH (ref 0.2–1.0)
Monocyte % (HT): 11 % (ref 4–12)
Neutrophil # (HT): 5.4 10 3/uL (ref 1.5–7.7)
Platelets (HT): 245 10 3/uL (ref 140–400)
RBC (HT): 4.73 10 6/uL (ref 4.20–5.90)
RDW (HT): 12.8 % (ref 11.5–15.0)
Seg Neut % (HT): 53 % (ref 40–75)
WBC (HT): 10.2 10 3/uL (ref 4.0–10.8)

## 2020-12-14 ENCOUNTER — Ambulatory Visit: Payer: PRIVATE HEALTH INSURANCE | Admitting: Neurosurgery

## 2021-02-01 ENCOUNTER — Ambulatory Visit: Payer: PRIVATE HEALTH INSURANCE | Admitting: Neurosurgery

## 2021-02-22 ENCOUNTER — Ambulatory Visit: Payer: PRIVATE HEALTH INSURANCE | Admitting: Neurosurgery

## 2022-01-01 ENCOUNTER — Encounter: Payer: Self-pay | Admitting: Physician Assistant

## 2022-01-20 DIAGNOSIS — E1042 Type 1 diabetes mellitus with diabetic polyneuropathy: Secondary | ICD-10-CM | POA: Insufficient documentation

## 2022-01-20 LAB — UNMAPPED LAB RESULTS
Basophil # (HT): 0.1 10 3/uL (ref 0.0–0.2)
Basophil % (HT): 1 % (ref 0–2)
Eosinophil # (HT): 0.3 10 3/uL (ref 0.0–0.5)
Eosinophil % (HT): 3 % (ref 0–7)
Hematocrit (HT): 45 % (ref 40–52)
Hemoglobin (HGB) (HT): 15.1 g/dL (ref 13.0–17.5)
Lymphocyte # (HT): 2.4 10 3/uL (ref 0.9–3.8)
Lymphocyte % (HT): 32 % (ref 17–44)
MCHC (HT): 33.5 g/dL (ref 32.0–36.0)
MCV (HT): 90.4 fL (ref 81.0–99.0)
Mean Corpuscular Hemoglobin (MCH) (HT): 30.3 pg (ref 26.0–34.0)
Monocyte # (HT): 0.7 10 3/uL (ref 0.2–1.0)
Monocyte % (HT): 10 % (ref 4–12)
Neutrophil # (HT): 4 10 3/uL (ref 1.5–7.7)
Platelets (HT): 234 10 3/uL (ref 150–450)
RBC (HT): 4.99 10 6/uL (ref 4.20–5.90)
RDW (HT): 12.1 % (ref 11.5–15.0)
Seg Neut % (HT): 54 % (ref 40–75)
WBC (HT): 7.4 10 3/uL (ref 4.0–10.8)

## 2022-03-04 NOTE — Progress Notes (Signed)
Subjective:     Tyler Jenkins is a 57 y.o. male who presents for consultation of type 1 diabetes.      DM Type DM1   Onset year (age) 44 (y/o)   Retinopathy     Last dilated exam yes     2023   Nephropathy     Last microalb/creat         ACE inh/ARB No  No results found for: "MALBC"      Losartan 25mg  daily   Neuropathy     Podiatrist Intermittent pain     No   Prior CV event     Statin      ASA Yes, CAD     Rosuvastatin 20mg  daily     ASA 81mg  daily   Recent HbA1c Lab Results   Component Value Date    HBA1C 9.3 (H) 01/20/2022    HBA1C 9.2 (H) 12/07/2020    HBA1C 9.3 (H) 09/07/2020       Last eGFR No results found for: "GFR1"   Last TSH No results found for: "TSH"   Current diabetes regimen insulin             Past Medical History:   Diagnosis Date   . Diabetes mellitus    . Hypercholesterolemia    . Hypertension       Past Surgical History:   Procedure Laterality Date   . ORTHOPEDIC PROCEDURES     . TONSILLECTOMY       No family history on file.  No Known Allergies  Current Outpatient Medications   Medication Sig Dispense Refill   . aspirin 81 MG Oral EC tablet Take 1 tablet by mouth daily.     . blood-glucose sensor (FREESTYLE LIBRE 3 SENSOR) Misc Devi 1 Units by Misc.(Non-Drug; Combo Route) route every 14 (fourteen) days. 2 each 5   . carvediloL 6.25 MG Oral tablet Take 1 tablet by mouth 2 (two) times daily with meals. 180 tablet 3   . cholecalciferol, vitamin D3, 50 mcg (2,000 unit) Oral Tab Take 1 tablet by mouth.     . gabapentin 300 MG Oral capsule Take 2 capsules by mouth at bedtime. 180 capsule 1   . glucagon 3 mg/actuation Nasl Spry Inhale one dose (3 mg) into one nostril once as needed for low blood sugar. If no response in 15 min, inhale second dose from new device     . HUMALOG U-100 INSULIN 100 unit/mL SubQ injection FOR USE WITH INSULIN PUMP AS DIRECTED. UP TO 80 UNITS A DAY. 70 mL 5   . LOSARTAN 25 MG Oral tablet TAKE 1 TABLET BY MOUTH EVERY DAY 90 tablet 3   . melatonin 10 mg Oral Tab Take  by mouth.      . METFORMIN 500 MG Oral 24 hr tablet TAKE 2 TABLETS BY MOUTH 2 TIMES DAILY (WITH MEALS) SWALLOW WHOLE. DO NOT CRUSH, BREAK, OR CHEW. 360 tablet 3   . ROSUVASTATIN 20 MG Oral tablet TAKE 1 TABLET BY MOUTH EVERY DAY 90 tablet 3   . tadalafiL 20 MG Oral tablet Take 1 tablet by mouth.     . zinc sulfate 220 (50) mg Oral capsule Take 1 capsule by mouth daily.       No current facility-administered medications for this visit.     Visit Vitals  BP 110/66   Pulse 88   Ht 5\' 8"  (1.727 m)   Wt 171 lb (77.6 kg)   BMI 26.00 kg/m  Smoking Status Never   BSA 1.93 m         Physical exam:  Eyes: PERRLA/EOMI normal sclera  Cardiovascular: Normal rate, regular rhythm and normal heart sounds.   Pulmonary/Chest: Effort normal and breath sounds normal. No respiratory distress.   HENT: Thyroid normal   Lymphatic:no lymphadenopathy  GI: abdomen soft, NT/ND  Skin: Skin is warm and dry. No rashes   Neurological: Alert and oriented to person, place, and time.  Psychiatric: normal affect/thoughts  Feet: Normal vibratory sense. Normal pinprick      Labs:    HEMOGLOBIN A1C   Date Value Ref Range Status   01/20/2022 9.3 (H) 4.2 - 5.6 % Final     Comment:     Per ADA 2018 Guidelines, HbA1c values should be interpreted as follows:      Normal:       less than 5.7%      Prediabetes:  5.7% - 6.4%      Diabetes:     greater than 6.4%  Samples containing >7% HbF may yield lower than expected HbA1c results.  Additional testing not affected by HbF can be requested if clinically  indicated. Contact [phone] for more information.       CHOLESTEROL   Date Value Ref Range Status   01/20/2022 99 0 - 199 mg/dL Final     Comment:     Low-risk levels (desirable): < 200 mg/dL  Moderate-risk levels (borderline): 200 -239 mg/dL  High-risk levels: > 454 mg/dL       HDL CHOLESTEROL   Date Value Ref Range Status   01/20/2022 50 (L) 60 - 401 mg/dL Final     Comment:     Low (undesirable, high risk): < 40 mg/dL  High (desirable, low risk): > 59 mg/dL        LDL (calc)   Date Value Ref Range Status   01/20/2022 37 0 - 99 mg/dL Final     TRIGLYCERIDES   Date Value Ref Range Status   01/20/2022 62 <150 mg/dL Final     Comment:     Normal: < 150  Borderline high: 150 -199  High: 200 -499  Very high: > 499       CHOL/HDL RATIO   Date Value Ref Range Status   01/20/2022 2.0  Final     Comment:     CHOLESTEROL/HDL RATIO INTERPRETATION:                                 MEN        WOMEN    1/2 Average Risk             3.4         3.4    Average Risk                 5.0         4.4    2 Times Average Risk         9.6         7.1    3 Times Average Risk        23.4        11.0       ALT   Date Value Ref Range Status   01/20/2022 35 10 - 49 U/L Final     POTASSIUM   Date Value Ref Range Status   01/20/2022 5.5 (H) 3.5 -  5.1 mmol/L Final     SODIUM   Date Value Ref Range Status   01/20/2022 134 (L) 136 - 145 mmol/L Final     CHLORIDE   Date Value Ref Range Status   01/20/2022 100 98 - 107 mmol/L Final     CO2   Date Value Ref Range Status   01/20/2022 24 20 - 31 mmol/L Final     BUN   Date Value Ref Range Status   01/20/2022 23 9 - 23 mg/dL Final     CREATININE   Date Value Ref Range Status   01/20/2022 0.8 0.7 - 1.2 mg/dL Final             Assessment/Plan:   Type 1 Diabetes: Uncontrolled. A comprehensive diabetes plan was discussed with the patient. I stressed need for good glycemic control to reduce risk of micro and macrovascular complications.  Change insulin pump settings below.  We will order a tandem and possibly libre or Dexcom.    Insulin Instructions  Medtronic 670G/Libre   insulin lispro U-100 100 unit/mL Soln   Last edited by Delice Lesch, DO on 03/04/2022 at 1:22 PM      Basal Rate   Total Basal Dose: 36 units/day   Time units/hr   12:00 AM 1.5      Blood Glucose Target   Time mg/dL   78:29 AM 562 - 130      Sensitivity Factor   Time mg/dL/unit   86:57 AM 40      Carb Ratio   Time g/unit   12:00 AM 9         Hypertension: At goal no changes.    Hyperlipidemia: At  goal no changes.

## 2022-04-21 LAB — UNMAPPED LAB RESULTS
Basophil # (HT): 0.1 10 3/uL (ref 0.0–0.2)
Basophil % (HT): 1 % (ref 0–2)
Eosinophil # (HT): 0.3 10 3/uL (ref 0.0–0.5)
Eosinophil % (HT): 4 % (ref 0–7)
HBV Core Ab (HT): NEGATIVE
Hematocrit (HT): 43 % (ref 40–52)
Hemoglobin (HGB) (HT): 14.5 g/dL (ref 13.0–17.5)
Lymphocyte # (HT): 2.9 10 3/uL (ref 0.9–3.8)
Lymphocyte % (HT): 39 % (ref 17–44)
MCHC (HT): 33.8 g/dL (ref 32.0–36.0)
MCV (HT): 89.2 fL (ref 81.0–99.0)
Mean Corpuscular Hemoglobin (MCH) (HT): 30.1 pg (ref 26.0–34.0)
Monocyte # (HT): 0.8 10 3/uL (ref 0.2–1.0)
Monocyte % (HT): 11 % (ref 4–12)
Neutrophil # (HT): 3.4 10 3/uL (ref 1.5–7.7)
Platelets (HT): 227 10 3/uL (ref 150–450)
RBC (HT): 4.81 10 6/uL (ref 4.20–5.90)
RDW (HT): 12.2 % (ref 11.5–15.0)
Seg Neut % (HT): 46 % (ref 40–75)
WBC (HT): 7.4 10 3/uL (ref 4.0–10.8)

## 2022-06-03 NOTE — Progress Notes (Signed)
Diabetes   Tyler Jenkins presents for follow-up for type 1 diabetes mellitus.       Insulin Instructions  Tandem Control IQ   insulin lispro U-100 100 unit/mL Soln   Last edited by Sharolyn Douglas, RN CDE on 04/15/2022 at 4:06 PM      Active Insulin:  3 hours    Max Bolus: 15 units    Auto soft XC- 6 mm/ 43 inch      Basal Rate   Total Basal Dose: 31.5 units/day   Time units/hr   12:00 AM 1    7:00 AM 1.5   10:00 PM 1      Blood Glucose Target   Time mg/dL   16:10 AM 960 - 454      Sensitivity Factor   Time mg/dL/unit   09:81 AM 40      Carb Ratio   Time g/unit   12:00 AM 9           DIABETES CARE PLAN      Diabetes Annual Checklist      Flowsheet Row Office Visit from 06/03/2022 in [facility] [place]   Statin Medications    Statin Therapy Yes   Medications Crestor (Rosuvastatin)   Foot Exam    Comprehensive LE exam with monofiliament 06/03/22   Neuropathy Present? No Neuropathy   Nephropathy No   Evidence of Diabetic Retinopathy --   Retinal Screening (The eye exam MUST be a dilated eye exam) --   ACE/ARB Yes   Medications Cozaar (losartan)   Diabetes Education 06/03/22   Diabetes Medications Tried    Metformin --   Sulfonylurea --   DDP-IV Inhibitor --   SGLT Inhibitor --   GLP1 Agonist --   TZD --   Basal Insulin --                Patient's medications, allergies, past medical, surgical, social and family histories were reviewed and updated as appropriate.  Current Outpatient Medications   Medication Sig Dispense Refill   . aspirin 81 MG Oral EC tablet Take 1 tablet by mouth daily.     . blood-glucose sensor (DEXCOM G7 SENSOR) Misc Devi Use one every 10 days-  Patient switching to tandem control IQ 9 each 2   . CARVEDILOL 6.25 MG Oral tablet TAKE 1 TABLET BY MOUTH TWICE A DAY WITH MEALS 180 tablet 3   . cholecalciferol, vitamin D3, 50 mcg (2,000 unit) Oral Tab Take 1 tablet by mouth.     . gabapentin 300 MG Oral capsule Take 2 capsules by mouth at bedtime. 180 capsule 1   . glucagon 3 mg/actuation Nasl Spry  Inhale one dose (3 mg) into one nostril once as needed for low blood sugar. If no response in 15 min, inhale second dose from new device     . HUMALOG U-100 INSULIN 100 unit/mL SubQ injection FOR USE WITH INSULIN PUMP AS DIRECTED. UP TO 80 UNITS A DAY. 70 mL 5   . LOSARTAN 25 MG Oral tablet TAKE 1 TABLET BY MOUTH EVERY DAY 90 tablet 3   . melatonin 10 mg Oral Tab Take  by mouth.     . ROSUVASTATIN 20 MG Oral tablet TAKE 1 TABLET BY MOUTH EVERY DAY 90 tablet 3   . tadalafiL 20 MG Oral tablet Take 1 tablet by mouth as needed for Erectile Dysfunction.     Marland Kitchen zinc sulfate 220 (50) mg Oral capsule Take 1 capsule by mouth daily.  No current facility-administered medications for this visit.       Visit Vitals  BP 100/64 (BP Location: Left arm, Patient Position: Sitting)   Pulse 80   Ht 5\' 8"  (1.727 m)   Wt 176 lb (79.8 kg)   BMI 26.76 kg/m   Smoking Status Never   BSA 1.96 m     There were no vitals filed for this visit.        Objective:    Physical Exam  Eyes: PERRLA/EOMI normal sclera  Cardiovascular: Normal rate, regular rhythm and normal heart sounds.   Pulmonary/Chest: Effort normal and breath sounds normal. No respiratory distress.   HENT: Thyroid normal   Lymphatic:no lymphadenopathy  GI: abdomen soft, NT/ND  Skin: Skin is warm and dry. No rashes   Neurological: Alert and oriented to person, place, and time.  Psychiatric: normal affect/thoughts  Feet: Normal vibratory sense. Normal pinprick        HEMOGLOBIN A1C   Date Value Ref Range Status   04/21/2022 10.1 (H) 4.2 - 5.6 % Final     Comment:     Per ADA 2018 Guidelines, HbA1c values should be interpreted as follows:      Normal:       less than 5.7%      Prediabetes:  5.7% - 6.4%      Diabetes:     greater than 6.4%  Samples containing >7% HbF may yield lower than expected HbA1c results.  Additional testing not affected by HbF can be requested if clinically  indicated. Contact [phone] for more information.       CHOLESTEROL   Date Value Ref Range  Status   01/20/2022 99 0 - 199 mg/dL Final     Comment:     Low-risk levels (desirable): < 200 mg/dL  Moderate-risk levels (borderline): 200 -239 mg/dL  High-risk levels: > 562 mg/dL       HDL CHOLESTEROL   Date Value Ref Range Status   01/20/2022 50 (L) 60 - 401 mg/dL Final     Comment:     Low (undesirable, high risk): < 40 mg/dL  High (desirable, low risk): > 59 mg/dL       LDL (calc)   Date Value Ref Range Status   01/20/2022 37 0 - 99 mg/dL Final     TRIGLYCERIDES   Date Value Ref Range Status   01/20/2022 62 <150 mg/dL Final     Comment:     Normal: < 150  Borderline high: 150 -199  High: 200 -499  Very high: > 499       CHOL/HDL RATIO   Date Value Ref Range Status   01/20/2022 2.0  Final     Comment:     CHOLESTEROL/HDL RATIO INTERPRETATION:                                 MEN        WOMEN    1/2 Average Risk             3.4         3.4    Average Risk                 5.0         4.4    2 Times Average Risk         9.6         7.1  3 Times Average Risk        23.4        11.0       ALT   Date Value Ref Range Status   04/21/2022 55 (H) 10 - 49 U/L Final     TSH   Date Value Ref Range Status   04/21/2022 1.75 0.55 - 4.78 uIU/mL Final     Comment:     Method: Third Generation Ultrasensitive TSH     TSH values may be falsely depressed, as low as  less than 0.01 uIU/mL, for 48 to 72 hours in the presence  of fluorescein dyes. Please consider submitting samples  after fluorescein clearance to prevent interference with  TSH test results.       POTASSIUM   Date Value Ref Range Status   04/21/2022 4.8 3.5 - 5.1 mmol/L Final     SODIUM   Date Value Ref Range Status   04/21/2022 139 136 - 145 mmol/L Final     Comment:     Verified by repeat Analysis.     CHLORIDE   Date Value Ref Range Status   04/21/2022 104 98 - 107 mmol/L Final     Comment:     Verified by repeat Analysis.     CO2   Date Value Ref Range Status   04/21/2022 31 20 - 31 mmol/L Final     BUN   Date Value Ref Range Status   04/21/2022 15 9 - 23 mg/dL  Final     Comment:     Verified by repeat Analysis.     CREATININE   Date Value Ref Range Status   04/21/2022 0.9 0.7 - 1.2 mg/dL Final           Assessment/Plan:   Type 1 Diabetes: Uncontrolled.  Improved.  Pump changes below.    Insulin Instructions  Tandem Control IQ   insulin lispro U-100 100 unit/mL Soln   Last edited by Delice Lesch, DO on 06/03/2022 at 8:33 AM      Active Insulin:  3 hours    Max Bolus: 15 units    Auto soft XC- 6 mm/ 43 inch      Basal Rate   Total Basal Dose: 33.6 units/day   Time units/hr   12:00 AM 1.3    7:00 AM 1.5   10:00 PM 1      Blood Glucose Target   Time mg/dL   16:10 AM 960 - 454      Sensitivity Factor   Time mg/dL/unit   09:81 AM 30      Carb Ratio   Time g/unit   12:00 AM 7         Hypertension: At goal. No medications changes needed.    Hyperlipidemia: At goal. No medication changes needed.

## 2022-10-31 NOTE — Telephone Encounter (Signed)
LOV 06/03/22   NOV not scheduled

## 2023-02-16 ENCOUNTER — Observation Stay: Payer: Medicaid Other

## 2023-02-16 ENCOUNTER — Encounter: Payer: Self-pay | Admitting: Nurse Practitioner

## 2023-02-16 ENCOUNTER — Other Ambulatory Visit: Payer: Self-pay

## 2023-02-16 ENCOUNTER — Observation Stay
Admission: EM | Admit: 2023-02-16 | Discharge: 2023-02-17 | Disposition: A | Discharge status: Home / Self Care | Payer: Medicaid Other | Source: Ambulatory Visit | Attending: Family Medicine | Admitting: Family Medicine

## 2023-02-16 DIAGNOSIS — Z794 Long term (current) use of insulin: Secondary | ICD-10-CM | POA: Insufficient documentation

## 2023-02-16 DIAGNOSIS — I2510 Atherosclerotic heart disease of native coronary artery without angina pectoris: Secondary | ICD-10-CM | POA: Insufficient documentation

## 2023-02-16 DIAGNOSIS — R079 Chest pain, unspecified: Principal | ICD-10-CM | POA: Insufficient documentation

## 2023-02-16 DIAGNOSIS — E785 Hyperlipidemia, unspecified: Secondary | ICD-10-CM | POA: Insufficient documentation

## 2023-02-16 DIAGNOSIS — Z789 Other specified health status: Secondary | ICD-10-CM

## 2023-02-16 DIAGNOSIS — E1065 Type 1 diabetes mellitus with hyperglycemia: Secondary | ICD-10-CM

## 2023-02-16 DIAGNOSIS — R0789 Other chest pain: Secondary | ICD-10-CM

## 2023-02-16 DIAGNOSIS — E109 Type 1 diabetes mellitus without complications: Secondary | ICD-10-CM | POA: Insufficient documentation

## 2023-02-16 DIAGNOSIS — R072 Precordial pain: Secondary | ICD-10-CM

## 2023-02-16 DIAGNOSIS — I10 Essential (primary) hypertension: Secondary | ICD-10-CM | POA: Insufficient documentation

## 2023-02-16 DIAGNOSIS — Z9641 Presence of insulin pump (external) (internal): Secondary | ICD-10-CM | POA: Insufficient documentation

## 2023-02-16 DIAGNOSIS — G4733 Obstructive sleep apnea (adult) (pediatric): Secondary | ICD-10-CM | POA: Insufficient documentation

## 2023-02-16 LAB — CBC AND DIFFERENTIAL
Baso # K/uL: 0 10*3/uL (ref 0.0–0.2)
Eos # K/uL: 0.3 10*3/uL (ref 0.0–0.5)
Hematocrit: 43 % (ref 37–52)
Hemoglobin: 14.4 g/dL (ref 12.0–17.0)
IMM Granulocytes #: 0 10*3/uL (ref 0.0–0.0)
IMM Granulocytes: 0.4 %
Lymph # K/uL: 2.6 10*3/uL (ref 1.0–5.0)
MCV: 89 fL (ref 75–100)
Mono # K/uL: 1 10*3/uL (ref 0.1–1.0)
Neut # K/uL: 4.3 10*3/uL (ref 1.5–6.5)
Nucl RBC # K/uL: 0 10*3/uL (ref 0.0–0.0)
Nucl RBC %: 0 /100{WBCs} (ref 0.0–0.2)
Platelets: 217 10*3/uL (ref 150–450)
RBC: 4.8 MIL/uL (ref 4.0–6.0)
RDW: 12.4 % (ref 0.0–15.0)
Seg Neut %: 51.7 %
WBC: 8.4 10*3/uL (ref 3.5–11.0)

## 2023-02-16 LAB — LIPID PANEL
Chol/HDL Ratio: 2.1
Cholesterol: 104 mg/dL
HDL: 50 mg/dL (ref 40–60)
LDL Calculated: 41 mg/dL
Non HDL Cholesterol: 54 mg/dL
Triglycerides: 63 mg/dL

## 2023-02-16 LAB — POCT GLUCOSE
Glucose POCT: 97 mg/dL (ref 60–99)
Glucose POCT: 268 mg/dL — ABNORMAL HIGH (ref 60–99)

## 2023-02-16 LAB — BASIC METABOLIC PANEL
Anion Gap: 9 (ref 7–16)
CO2: 27 mmol/L (ref 20–28)
Calcium: 9.5 mg/dL (ref 8.6–10.2)
Chloride: 102 mmol/L (ref 96–108)
Creatinine: 0.98 mg/dL (ref 0.67–1.17)
Glucose: 139 mg/dL — ABNORMAL HIGH (ref 60–99)
Lab: 15 mg/dL (ref 6–20)
Potassium: 4.6 mmol/L (ref 3.3–5.1)
Sodium: 138 mmol/L (ref 133–145)
eGFR BY CREAT: 90 *

## 2023-02-16 LAB — TROPONIN T 3 HR W/ DELTA HIGH SENSITIVITY (IP/ED ONLY)
TROP T 0-3 HR DELTA High Sensitivity: -2 — ABNORMAL LOW (ref 0–4)
TROP T 3 HR High Sensitivity: 7 ng/L (ref 0–14)

## 2023-02-16 LAB — D-DIMER, QUANTITATIVE: D-Dimer: 0.43 ug{FEU}/mL (ref 0.00–0.50)

## 2023-02-16 LAB — TROPONIN T 1 HR W/ DELTA HIGH SENSITIVITY
TROP T 0-1 HR DELTA High Sensitivity: 0 (ref 0–2)
TROP T 1 HR High Sensitivity: 9 ng/L (ref 0–11)

## 2023-02-16 LAB — MAGNESIUM: Magnesium: 1.9 mg/dL (ref 1.6–2.5)

## 2023-02-16 LAB — TROPONIN T 0 HR HIGH SENSITIVITY (IP/ED ONLY): TROP T 0 HR High Sensitivity: 9 ng/L (ref 0–11)

## 2023-02-16 LAB — NT-PRO BNP: NT-pro BNP: <50 pg/mL (ref 0–900)

## 2023-02-16 MED ORDER — GABAPENTIN 300 MG PO CAPSULE *I*
600.0000 mg | ORAL_CAPSULE | Freq: Every evening | ORAL | Status: DC
Start: 2023-02-16 — End: 2023-04-17
  Administered 2023-02-16: 600 mg via ORAL
  Filled 2023-02-16: qty 2

## 2023-02-16 MED ORDER — GLUCOSE 15 GM/32ML PO GEL *I*
15.0000 g | ORAL | Status: DC | PRN
Start: 2023-02-16 — End: 2023-02-17

## 2023-02-16 MED ORDER — DEXTROSE 50 % IV SOLN *I*
25.0000 g | INTRAVENOUS | Status: DC | PRN
Start: 2023-02-16 — End: 2023-02-17

## 2023-02-16 MED ORDER — INSULIN SQ BOLUS (HUMALOG) 100 UNITS/ML FROM PUMP *I*
SUBCUTANEOUS | Status: DC | PRN
Start: 2023-02-16 — End: 2023-02-17
  Filled 2023-02-16: qty 10

## 2023-02-16 MED ORDER — ALUM & MAG HYDROXIDE-SIMETH 200-200-20 MG/5ML PO SUSP *I*
30.0000 mL | Freq: Four times a day (QID) | ORAL | Status: DC | PRN
Start: 2023-02-16 — End: 2023-02-23

## 2023-02-16 MED ORDER — CARVEDILOL 6.25 MG PO TABS *I*
6.2500 mg | ORAL_TABLET | Freq: Two times a day (BID) | ORAL | Status: DC
Start: 2023-02-16 — End: 2023-04-17
  Administered 2023-02-16: 6.25 mg via ORAL
  Filled 2023-02-16: qty 1
  Filled 2023-02-16: qty 2
  Filled 2023-02-16: qty 1

## 2023-02-16 MED ORDER — ASPIRIN 81 MG PO TBEC *I*
81.0000 mg | DELAYED_RELEASE_TABLET | Freq: Every day | ORAL | Status: DC
Start: 2023-02-17 — End: 2023-02-17
  Administered 2023-02-17: 81 mg via ORAL
  Filled 2023-02-16: qty 1

## 2023-02-16 MED ORDER — ACETAMINOPHEN 325 MG PO TABS *I*
650.0000 mg | ORAL_TABLET | Freq: Four times a day (QID) | ORAL | Status: DC | PRN
Start: 2023-02-16 — End: 2023-02-17

## 2023-02-16 MED ORDER — INSULIN SQ (HUMALOG 100 UNITS/ML) PUMP REFILL *I*
300.0000 [IU] | SUBCUTANEOUS | Status: DC | PRN
Start: 2023-02-16 — End: 2023-02-17
  Administered 2023-02-16: 300 [IU]
  Filled 2023-02-16: qty 3

## 2023-02-16 MED ORDER — GLUCAGON HCL (RDNA) 1 MG IJ SOLR *WRAPPED*
1.0000 mg | INTRAMUSCULAR | Status: DC | PRN
Start: 2023-02-16 — End: 2023-04-17

## 2023-02-16 MED ORDER — INSULIN PUMP ACTIVE ORDER *I*
Status: DC | PRN
Start: 2023-02-16 — End: 2023-02-17

## 2023-02-16 MED ORDER — ROSUVASTATIN CALCIUM 20 MG PO TABS *I*
20.0000 mg | ORAL_TABLET | Freq: Every day | ORAL | Status: DC
Start: 2023-02-17 — End: 2023-02-17
  Administered 2023-02-17: 20 mg via ORAL
  Filled 2023-02-16: qty 1

## 2023-02-16 MED ORDER — JUICE (FOR HYPOGLYCEMIA) *I*
120.0000 mL | ORAL | Status: DC | PRN
Start: 2023-02-16 — End: 2023-04-17

## 2023-02-16 MED ORDER — ONDANSETRON HCL 2 MG/ML IV SOLN *I*
4.0000 mg | Freq: Four times a day (QID) | INTRAMUSCULAR | Status: DC | PRN
Start: 2023-02-16 — End: 2023-04-17

## 2023-02-16 MED ORDER — LOSARTAN POTASSIUM 50 MG PO TABS *I*
25.0000 mg | ORAL_TABLET | Freq: Every day | ORAL | Status: DC
Start: 2023-02-17 — End: 2023-02-17

## 2023-02-16 MED ORDER — INSULIN SQ BOLUS (HUMALOG) 100 UNITS/ML FROM PUMP *I*
Freq: Three times a day (TID) | SUBCUTANEOUS | Status: DC
Start: 2023-02-16 — End: 2023-02-17
  Administered 2023-02-17: 2.14 [IU] via SUBCUTANEOUS
  Filled 2023-02-16: qty 10

## 2023-02-16 NOTE — ED Triage Notes (Signed)
From UC. C/o intermittent chest pain for the last few months. Pt with c/o mid-sternal chest pain this AM +SOB +nausea; since resolved. Denies dizziness. EKG in triage.    Prehospital medications given: No

## 2023-02-16 NOTE — Bed Hold Note (Signed)
Bed: OU-83  Expected date:   Expected time:   Means of arrival:   Comments:  Tyler Jenkins

## 2023-02-16 NOTE — Provider Consult (Addendum)
Division of Endocrinology, Diabetes [facility], 3rd Floor  [address], [place], [place] [address]  Phone: [phone]     [facility] Endocrinology - Inpatient Consult Note    Consult Requested by: Tyler Jenkins*   Consult Question: Management of Type 1 Diabetes    Chief Complaint: Hyperglycemia    History of Present Illness: Tyler Jenkins is a 58 y.o. male with type 1 diabetes, CAD, HLD, HTN, OSA who presents with intermittent chest pain since September.     Mr. Hopkin was diagnosed with type 1 diabetes at age 60 years and has been on insulin since that time. He is followed by Dr. Crist Fat since January 2024. He is managed with lispro via Tandem Tslim + Dexcom G7. Pump settings: Basal: 12A: 1.3 units/hour, 7A: 1.5 units/hour, 10P: 1 unit/hour = 33.6 units/day. Bolus: I:C: 1:7, sensitivity: 30 mg/dl, target: 956 mg/dl, active insulin time: 5 hours. HBA1C in March 2024 was 10.1%. Patient notes recent glucose of 180-200 mg/dl. He denies recent hypoglycemia or hypoglycemia unawareness. He denies blurred vision, polyuria, polydipsia or weight loss. He has known retinopathy and LE paraesthesias. He denies nephropathy.       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  Family History   Problem Relation Age of Onset    Diabetes Type I Brother     Diabetes Type II Paternal Grandfather      Social History     Socioeconomic History    Marital status: Married   Tobacco Use    Smoking status: Never    Smokeless tobacco: Never   Substance and Sexual Activity    Alcohol use: Yes     Comment: twice weekly    Drug use: Never       Allergies: No Known Allergies (drug, envir, food or latex)    Active Hospital Medications:  Current Facility-Administered Medications   Medication Dose Route Frequency    Active SubQ Insulin Pump   Subcutaneous Continuous PRN     Juice (for hypoglycemia) 120 mL  120 mL Oral PRN    And    Glucose (TRUEPLUS) 15 GM oral gel 15 g of Glucose  15 g of Glucose Oral PRN    And    dextrose 50% (0.5 g/mL) injection 25 g  25 g Intravenous PRN    And    glucagon (GLUCAGEN) injection 1 mg  1 mg Intramuscular PRN    insulin lispro (HumaLOG,ADMELOG) injection for insulin pump - PUMP REFILL 300 units  300 units PUMP- REFILL PRN    And    insulin lispro (HumaLOG,ADMELOG) injection for insulin pump - BOLUS   Subcutaneous PRN    And    insulin lispro (HumaLOG,ADMELOG) injection for insulin pump - BOLUS   Subcutaneous TID AC    [START ON 02/17/2023] aspirin EC tablet 81 mg  81 mg Oral Daily    carvedilol (COREG) tablet 6.25 mg  6.25 mg Oral BID WC    gabapentin (NEURONTIN) capsule 600 mg  600 mg Oral Nightly    [START ON 02/17/2023] losartan (COZAAR) tablet 25 mg  25 mg Oral Daily    [START ON 02/17/2023] rosuvastatin (CRESTOR) tablet 20 mg  20 mg Oral Daily    acetaminophen (TYLENOL) tablet 650 mg  650 mg Oral Q6H PRN  ondansetron (ZOFRAN) injection 4 mg  4 mg Intravenous Q6H PRN    aluminum & magnesium hydroxide w/simethicone (MAALOX ADVANCED REGULAR) suspension 30 mL  30 mL Oral Q6H PRN     Current Outpatient Medications   Medication    rosuvastatin (CRESTOR) 40 mg tablet    carvedilol (COREG) 6.25 mg tablet    losartan (COZAAR) 25 mg tablet    gabapentin (NEURONTIN) 300 MG capsule    aspirin EC (ASPIRIN EC) 81 mg EC tablet    glucagon (BAQSIMI) 3 MG/DOSE nasal powder    insulin glargine 100 unit/mL injection pen    Insulin Pen Needle (PEN NEEDLES) 31G X 6 MM    insulin lispro 100 UNIT/ML injection vial    tadalafil (CIALIS) 20 MG tablet    cholecalciferol (VITAMIN D) 50 MCG (2000 UT) tablet    zinc sulfate (ZINCATE) 220 (50 Zn) MG capsule       Review of Systems:  CONSTITUTIONAL:  Appetite good, no fevers, or weight loss  HEAD: No headache, dizziness, or syncope  EYES:No vision changes or eye pain  ENT: No hearing difficulties or ear pain  CV:+ chest pain,  shortness of breath, no edema.  RESPIRATORY:  No cough, wheezing, or dyspnea  GI:  + nausea with chest pain, no vomiting, abdominal pain, or change in bowel habits  GU:  No dysuria, urgency, or incontinence  NEURO:  No mental status changes, motor weakness, or sensory changes  PSYCH:  No depression or anxiety  MS:  No joint pain, swelling, or musculoskeletal deformities  SKIN:  No rashes  HEME/LYMPH:  No easy bleeding, bruising, or swollen nodes  ALL/IMMUM:  No allergic reactions  ENDOCRINE:  No polyuria, polydipsia, or heat intolerance.      Physical Examination:  Blood pressure 113/69, pulse 74, temperature 36.4 C (97.5 F), temperature source Temporal, resp. rate 18, height 1.702 m (5\' 7" ), weight 78 kg (172 lb), SpO2 100%.  GENERAL APPEARANCE: well developed, well nourished, no acute distress  HEENT: Pupils equal, EOMI, no lid lag, pink conjunctiva  NECK: supple, no thyromegaly  HEART: RRR, normal S1, S2, no MRG, no JVD or carotid bruit  CHEST: lungs clear to auscultation bilaterally  ABDOMEN: soft, nontender, nondistended, normoactive bowel sounds, negative hepatosplenomegaly  EXTREMITIES: no edema.  NEUROLOGICAL: Alert and oriented x 3.   SKIN:  No rashes.  LYMPH: no palpable cervical lymphadenopathy.    Nutrition-  Yesterday:   Breakfast:   Lunch:   Dinner:  Today:   Breakfast:    POCT BG Results:  No results for input(s): "PGLU" in the last 168 hours.      Lab Results:   Recent Labs   Lab 02/16/23  1033   Sodium 138   Potassium 4.6   Chloride 102   CO2 27   UN 15   Creatinine 0.98   Calcium 9.5   Glucose 139*     Recent Labs   Lab 02/16/23  1033   WBC 8.4   Hemoglobin 14.4   Hematocrit 43   Platelets 217      No results for input(s): "HA1C" in the last 168 hours.  No results for input(s): "TSH", "T3F", "FT4" in the last 168 hours.     Radiology impressions (last 3 days):  No results found.    Assessment: Tyler Jenkins is a 58 y.o. male with type 1 diabetes, CAD, HLD, HTN, OSA who presents with intermittent  chest pain since September.     Patient is alert  and oriented and able to manage his insulin pump. Insulin pump appears functional. He is low on insulin and CGM has expired. He does not have supplies with him. Provided appropriate supplies and assisted patient in restart of pump and CGM.       Plan:   Continue insulin pump at home settings  Monitor BG q ac hs and prn with hospital meter  Additionally, patient will continue with personal CGM for use with pump  HBA1C ordered  [facility] fellow on call if questions/concerns  Will follow    Author: Pearletha Alfred, NP  Note created: 02/16/2023  at: 5:16 PM

## 2023-02-16 NOTE — ED Notes (Addendum)
Plan of Care     Vitals Q4H  Trend labs  Reg diet  NPO at midnight   Stress test in AM  Cards following  Tele  Pain management  Meds per [facility]  CGM/insulin pump managment

## 2023-02-16 NOTE — ED Notes (Addendum)
 Pt arrived to unit from ED. Writer oriented him to unit and call bell system. Initial assessment completed. He offers no complaints at this time. POC reviewed and he verbalized understanding. Insulin pump and CGM agreement placed in chart. He is in bed and call bell is in reach. Tele reading NSR.

## 2023-02-16 NOTE — ED Notes (Signed)
Report Given To  Handoff completed on 02/16/2023 at 3:59 PM. Please call [phone] with questions.  Pt to travel in wheelchair .    Margo Common., RN        Descriptive Sentence / Reason for Admission   From UC. C/o intermittent chest pain for the last few months. Pt with c/o mid-sternal chest pain this AM +SOB +nausea; since resolved. Denies dizziness.       Active Issues / Relevant Events   FULL CODE  AO x4  Ambulatory        To Do List  VS/A  Meds per [facility]  Tele      Anticipatory Guidance / Discharge Planning  Admitted for stress test?

## 2023-02-16 NOTE — ED Obs Notes (Signed)
ADMISSION NOTE [facility] - LOCATION: EOU    Patient seen by me today, 02/16/2023 at 1600    History     The patient is a 58 y.o. male who has been placed on the EOU service after evaluation in the [facility] Emergency Department for Chest Pain.    Patient reports that since September 2024 he has had about 6 discrete episodes of substernal chest pain/tightness that lasts from 5-15 minutes, does not radiate to his arm. These episodes usually occur either at night and wake him up around 2-3 AM, or in the morning as he is sitting at his desk at work. They do not occur during exertion but instead sometimes after he has exerted himself. States he does feel nauseous during the episode and sometimes short of breath.  Endorses he has had more DOE for the last year. States that after each episode he often feels like taking a deep breath in makes chest feel tight. States he had long car trip beginning of December.     Denies current tobacco use but used to use chewing tobacco, stopped 2020.     Had an episode of chest pain this AM that lasted 15 minutes, had associated nausea and went to [facility] urgent care. They sent him to ED for further evaluation.     In ED VS with HR 81, RR 18 BP 137/79, satting 100% on RA, EKG with sinus rhythm , trops 9->9->7. Admitted to ED Obs for cards eval and possible ischemic eval.        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    No family history on file.    Social History      reports that he has never smoked. He has never used smokeless tobacco. No history on file for alcohol use, drug use, and sexual activity.     Living Situation       Questions Responses    Patient lives with     Homeless     Caregiver for other family member     External Services     Employment     Domestic Violence Risk             Review of Systems   Review of Systems    Constitutional:  Negative for fatigue.   HENT: Negative.     Respiratory:  Positive for shortness of breath.    Cardiovascular:  Positive for chest pain. Negative for palpitations and leg swelling.   Gastrointestinal:  Positive for nausea. Negative for diarrhea and vomiting.   Genitourinary: Negative.    Musculoskeletal: Negative.        Physical Exam   BP 113/69 (BP Location: Left arm)   Pulse 74   Temp 36.4 C (97.5 F) (Temporal)   Resp 18   Ht 1.702 m (5\' 7" )   Wt 78 kg (172 lb)   SpO2 100%   BMI 26.94 kg/m     Physical Exam  Constitutional:       Appearance: He is not ill-appearing.   Cardiovascular:      Rate and Rhythm: Normal rate and regular rhythm.      Heart sounds: Normal heart sounds.   Pulmonary:      Effort: Pulmonary effort is normal.   Musculoskeletal:  Right lower leg: No edema.      Left lower leg: No edema.   Neurological:      Mental Status: He is alert.         Tests     EKG interpretation:  normal EKG, early R wave progression in precordial leads    Telemetry interpretation: N/A     Labs:  All labs in the last 24 hours:   Recent Results (from the past 24 hours)   CBC and differential    Collection Time: 02/16/23 10:33 AM   Result Value Ref Range    WBC 8.4 3.5 - 11.0 THOU/uL    RBC 4.8 4.0 - 6.0 MIL/uL    Hemoglobin 14.4 12.0 - 17.0 g/dL    Hematocrit 43 37 - 52 %    MCV 89 75 - 100 fL    RDW 12.4 0.0 - 15.0 %    Platelets 217 150 - 450 THOU/uL    Seg Neut % 51.7 %    Neut # K/uL 4.3 1.5 - 6.5 THOU/uL    Lymph # K/uL 2.6 1.0 - 5.0 THOU/uL    Mono # K/uL 1.0 0.1 - 1.0 THOU/uL    Eos # K/uL 0.3 0.0 - 0.5 THOU/uL    Baso # K/uL 0.0 0.0 - 0.2 THOU/uL    Nucl RBC % 0.0 0.0 - 0.2 /100 WBC    Nucl RBC # K/uL 0.0 0.0 - 0.0 THOU/uL    IMM Granulocytes # 0.0 0.0 - 0.0 THOU/uL    IMM Granulocytes 0.4 %   Basic metabolic panel    Collection Time: 02/16/23 10:33 AM   Result Value Ref Range    Glucose 139 (H) 60 - 99 mg/dL    Sodium 161 096 - 045 mmol/L    Potassium 4.6 3.3 - 5.1 mmol/L     Chloride 102 96 - 108 mmol/L    CO2 27 20 - 28 mmol/L    Anion Gap 9 7 - 16    UN 15 6 - 20 mg/dL    Creatinine 4.09 8.11 - 1.17 mg/dL    eGFR BY CREAT 90 *    Calcium 9.5 8.6 - 10.2 mg/dL   Troponin T 0 HR High Sensitivity    Collection Time: 02/16/23 10:33 AM   Result Value Ref Range    TROP T 0 HR High Sensitivity 9 0 - 11 ng/L   NT-pro BNP    Collection Time: 02/16/23 10:33 AM   Result Value Ref Range    NT-pro BNP <50 0 - 900 pg/mL   Magnesium    Collection Time: 02/16/23 10:33 AM   Result Value Ref Range    Magnesium 1.9 1.6 - 2.5 mg/dL   Troponin T 1 HR W/ Delta High Sensitivity    Collection Time: 02/16/23 11:42 AM   Result Value Ref Range    TROP T 1 HR High Sensitivity 9 0 - 11 ng/L    TROP T 0-1 HR DELTA High Sensitivity 0 0 - 2   Troponin T 3 HR W/ Delta High Sensitivity    Collection Time: 02/16/23  1:57 PM   Result Value Ref Range    TROP T 3 HR High Sensitivity 7 0 - 14 ng/L    TROP T 0-3 HR DELTA High Sensitivity -2 (L) 0 - 4       Imaging results:   No results found.      Independent interpretation of imaging: N/A  Medical Decision Making     Assessment:    58 y.o. male w/ hx of T1DM, CAD (2018 cath with 50% blockage in LAD), HTN, HLD has been placed on the EOU service for Chest Pain    Differential Diagnosis includes unstable angina, stable angina, costochondritis, less likely ACS, pulmonary embolism                    Problems addressed and plan:   Chest Pain  Hx of CAD (2018 cath with 50% LAD blockage, not stented), hx of abnl exercise stress test  HTN, HLD  - suspect stable vs unstable angina, or costochondritis/GERD  - trops, EKG reassuring in ED  - cont tele, resting TTE ordered, will have cards see in AM to determine stress testing vs possible cath  - NPO at MN  - prn nitroglycerin for recurrence of pain  - would repeat EKG/trop if pain recurs and persists > 5 minutes  - add on lipids/A1C  - cont home coreg, losartan, crestor, ASA 81 daily  - d dimer 0.43, PE unlikely    T1DM  - uses  insulin pump, will consult endo  - cont nightly gabapentin for neuropathic pain      Discussions with Other Providers: ED Provider  Consultant teams previously spoken with by prior Providers: N/A    Parenteral controlled substances: none  Review of external/existing labs/records: ED visit  DVT prophylaxis: None  Smoking Cessation: NA  Code Status: FULL    SDoH affecting care (Long term issues affecting a patient's overall health):   None identified    Disposition Barriers (More acute issues that are limiting a patient's discharge today):   None identified        I personally spent 75 minutes on the calendar date of the encounter, including and pre and post visit work.         Author: [name], MD     [name], MD  02/16/23 [phone]

## 2023-02-16 NOTE — First Provider Contact (Signed)
ED First Provider Contact Note    Initial provider evaluation performed by   ED First Provider Contact       Date/Time Event User Comments    02/16/23 1011 ED First Provider Contact Tyler Jenkins L Initial Face to Face Provider Contact            Vital signs reviewed.    Assessment: 58 year old male with a history of hypertension, diabetes presents with chest pressure that started this morning.  Associated with shortness of breath.  Was evaluated at urgent care and sent for further evaluation.  He has had aspirin today.    Orders placed:  LABS     Patient requires further evaluation.     Tyler Puff, MD, 02/16/2023, 10:11 AM     Tyler Puff, MD  02/16/23 1012

## 2023-02-16 NOTE — ED Provider Notes (Signed)
History     Chief Complaint   Patient presents with    Chest Pain     HPI  Tyler Jenkins is a 59 y.o. male with history of type 1 diabetes, HTN who presents with chest pain.    Since September has had several episodes of chest pain described as mid sternal chest pressure. Nonradiating. Associated with shortness of breath and nausea. Lasts ~15 minutes then resolves. Had episode this morning so went to urgent  care then was referred to the ED for further evaluation.    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    No leg pain or swelling. States A1c is 8-9%. Had abnormal stress test in 2018 so had left heart cath showing "30% lesion" but did not get stented.       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       Social History     Tobacco Use    Smoking status: Never    Smokeless tobacco: Never             Review of Systems    Physical Exam     Triage Vitals  Triage Start: Start, (02/16/23 1008)  First Recorded BP: 137/79, Resp: 18, Temp: 36.4 C (97.5 F), Temp src: TEMPORAL Oxygen Therapy SpO2: 100 %, Oximetry Source: Rt Hand, O2 Device: None (Room air), Heart Rate: 81, (02/16/23 1009)  .  First Pain Reported  0-10 Scale: 6, Pain Location/Orientation: Chest, (02/16/23 1009)       Physical Exam  Vitals and nursing note reviewed.   Constitutional:       Appearance: He is not ill-appearing.   HENT:      Head: Normocephalic and atraumatic.      Mouth/Throat:      Pharynx: Oropharynx is clear.   Eyes:      Conjunctiva/sclera: Conjunctivae  normal.   Cardiovascular:      Rate and Rhythm: Normal rate and regular rhythm.   Pulmonary:      Effort: Pulmonary effort is normal. No respiratory distress.   Abdominal:      Palpations: Abdomen is soft.      Tenderness: There is no abdominal tenderness.   Musculoskeletal:         General: No deformity.      Cervical back: Neck supple.   Skin:     General: Skin is warm and dry.   Neurological:      General: No focal deficit present.      Mental Status: He is alert.   Psychiatric:         Thought Content: Thought content normal.         Medical Decision Making     Assessment:  Tyler Jenkins is a 58 y.o. male with history of type 1 diabetes, HTN who presents with intermittent chest pain and dyspnea on exertion over the past  few months. He is asymptomatic currently. Concern for ACS, lower suspicion CHF. Lower suspicion for infectious process, arrhythmia, PE. Will evaluate further with labs including troponins.     He does not have a cardiologist and has not had stress testing done since his abnormal one in 2018 prior to his cath, so I discussed with him that I think it would be beneficial to get a stress test as well here.     ED Course and Disposition:  Admitted to obs for stress test. Troponins adynamic.            Darcey Nora, MD            Tyler Jenkins, Faye Ramsay, MD  02/16/23 [phone]

## 2023-02-16 NOTE — Unmapped External Note (Signed)
[facility]   History  58 year old male with past medical history significant for CAD (last angiogram before COVID, patient reporting a 30% blockage in one of the vessels, but unsure exactly which vessel), hyperlipidemia, hypertension, type 1 diabetes (on insulin pump, reporting intermittent hyperglycemia), OSA (not using device) presenting to urgent care for evaluation of intermittent chest pain.  Patient reports 6 episodes of chest pain since September.  He states the most recent chest pain was this morning when he was sitting at his desk starting work.  He states the episode lasted approximately 5 minutes and he describes the pain as substernal and points to his mid chest.  Pain did not radiate, but he reports an overall sensation of shortness of breath.  He states that he first noticed the shortness of breath this summer, and recently noticed additional shortness of breath while trying to go up the garage steps.  He states he has not contacted his PCP about this concern, and presents today due to the episode of chest pain that occurred this morning.  Presently, he is not experiencing any chest pain, and states he feels back to his usual self.        Patient Active Problem List   Diagnosis Code   . Coronary artery disease I25.10   . Type 1 diabetes mellitus with retinopathy E10.319   . Erectile disorder due to medical condition in male N52.1   . Essential hypertension I10   . Hyperlipidemia, unspecified hyperlipidemia type E78.5   . Dupuytren's contracture M72.0   . Diabetic polyneuropathy associated with type 1 diabetes mellitus E10.42        Past Medical History:   Diagnosis Date   . Diabetes mellitus    . Hypercholesterolemia    . Hypertension        Past Surgical History:   Procedure Laterality Date   . ORTHOPEDIC PROCEDURES     . TONSILLECTOMY          No family history on file.     Social History     Tobacco Use   . Smoking status: Never   . Smokeless tobacco: Never   Substance Use Topics   .  Alcohol use: Yes   . Drug use: Never          Review of Systems   Respiratory:  Positive for shortness of breath.    Cardiovascular:  Positive for chest pain.   All other systems reviewed and are negative.         Physical Exam    Vitals:    02/16/23 0909   BP: 112/76   Pulse: 78   Resp: 18   Temp: 36.2 C (97.2 F)   TempSrc: Tympanic   SpO2: 97%         Physical Exam  Vitals and nursing note reviewed.   Constitutional:       General: He is not in acute distress.     Appearance: Normal appearance.   HENT:      Head: Normocephalic.      Nose: Nose normal.      Mouth/Throat:      Mouth: Mucous membranes are moist.      Pharynx: Oropharynx is clear.   Eyes:      Extraocular Movements: Extraocular movements intact.      Conjunctiva/sclera: Conjunctivae normal.      Pupils: Pupils are equal, round, and reactive to light.   Cardiovascular:      Rate and Rhythm:  Normal rate and regular rhythm.      Heart sounds: No murmur heard.     No friction rub. No gallop.   Pulmonary:      Effort: Pulmonary effort is normal. No respiratory distress.      Breath sounds: Normal breath sounds. No wheezing, rhonchi or rales.   Chest:      Chest wall: No tenderness.   Musculoskeletal:         General: Normal range of motion.   Skin:     General: Skin is warm and dry.   Neurological:      General: No focal deficit present.      Mental Status: He is alert and oriented to person, place, and time.           ED Procedures  Procedures     No results found for this visit on 02/16/23.     No results found.        ED Course  ED MDM:      Differential Diagnosis:     Differential diagnosis includes:  Acute coronary syndrome, STEMI/NSTEMI, arrhythmia, costochondritis, muscle strain, viral illness    ED Course:     ED course details:  58 year old male with past medical history significant for CAD (last angiogram before COVID, patient reporting a 30% blockage in one of the vessels, but unsure exactly which vessel), hyperlipidemia, hypertension, type 1  diabetes (on insulin pump, reporting intermittent hyperglycemia), OSA (not using device) presenting to urgent care for evaluation of intermittent chest pain.  He reports 6 episodes of chest pain which have occurred intermittently since September.    Last episode of chest pain this morning, lasting approximately 5 minutes.  Presently, patient denies any chest pain or shortness of breath.  On arrival, afebrile, nontachycardic, hemodynamically stable.  Saturating well on room air.  His physical exam relatively unrevealing, regular cardiac rate and rhythm, no appreciated murmurs gallops or rubs.  EKG revealing normal sinus with incomplete RBBB.  No prior EKGs on file for comparison.  Findings, risk factors and concerns discussed with the patient, and he declines EMS transfer to the ED.  He states that he will drive to [facility] for further diagnostic workup.  Patient states that he understands the risks involved, and prefers to drive self.    Disposition:       Transfer: patient transferred        Bartlomiej was seen today for chest pain.    Diagnoses and all orders for this visit:    Chest pain, unspecified type  -     EKG 12 lead (in office)         ED Attestation  Attestation      Macy Mis, FNP-C  02/16/23

## 2023-02-17 ENCOUNTER — Observation Stay: Payer: Medicaid Other | Admitting: Cardiology

## 2023-02-17 ENCOUNTER — Observation Stay: Payer: Medicaid Other

## 2023-02-17 ENCOUNTER — Other Ambulatory Visit: Payer: Self-pay

## 2023-02-17 DIAGNOSIS — R079 Chest pain, unspecified: Secondary | ICD-10-CM

## 2023-02-17 LAB — ECHO COMPLETE
Aortic Diameter (mid tubular): 3.2 cm
Aortic Diameter (sinus of Valsalva): 3.1 cm
BMI: 27 kg/m2
BP Diastolic: 65 mm[Hg]
BP Systolic: 113 mm[Hg]
BSA: 1.92 m2
Deceleration Time - MV: 213 ms
E/A ratio: 0.92
EF: 60 %
Heart Rate: 69 {beats}/min
Height: 67 in
IVC Diameter: 0.7 cm
LA Diameter BSA Index: 1.8 cm/m2
LA Diameter Height Index: 2 cm/m
LA Diameter: 3.4 cm
LA Systolic Vol BSA Index: 10.4 mL/m2
LA Systolic Vol Height Index: 11.8 mL/m
LA Systolic Volume: 20 mL
LV ASE Mass BSA Index: 63 g/m2
LV ASE Mass Height 2.7 Index: 28.8 g/m2
LV ASE Mass Height Index: 71.1 gm/m
LV ASE Mass: 120.9 g
LV Posterior Wall Thickness: 0.86 cm
LV Septal Thickness: 0.87 cm
LV wall/cavity ratio: 0.39
LVED Diameter BSA Index: 2.29 cm/m2
LVED Diameter Height Index: 2.58 cm/m
LVED Diameter: 4.39 cm
LVOT Area (calculated): 3.46 cm2
LVOT Cardiac Index: 2.25 L/min/m2
LVOT Cardiac Output: 4.32 L/min
LVOT Diameter: 2.1 cm
LVOT PWD VTI: 18.1 cm
LVOT PWD Velocity (mean): 59 cm/s
LVOT PWD Velocity (peak): 86 cm/s
LVOT SV BSA Index: 32.64 mL/m2
LVOT SV Height Index: 36.8 mL/m
LVOT Stroke Rate (mean): 204.2 mL/s
LVOT Stroke Rate (peak): 297.7 mL/s
LVOT Stroke Volume: 62.66 mL
MV Peak A Velocity: 84.9 cm/s
MV Peak E Velocity: 78 cm/s
Mitral Annular E/Ea Vel Ratio: 8.57
Mitral Annular Ea Velocity: 9.1 cm/s
RA Volume BSA Index: 16.1 mL/m2
RA Volume Height Index: 18.2 mL/m
RA Volume: 31 mL
RR Interval: 869.57 ms
SEM (LVOT Mean) mN-s: 38.84 mN-s
SEM (LVOT peak) mN-s: 56.61 mN-s
Tricuspid Annular S velocity: 12 cm/s
Weight (lbs): 172 [lb_av]
Weight: 2752 [oz_av]

## 2023-02-17 LAB — EXERCISE STRESS ECHO LIMITED (ISCHEMIA ONLY)
BMI: 27 kg/m2
BP Diastolic: 74 mm[Hg]
BP Systolic: 133 mm[Hg]
BSA: 1.92 m2
ECG PR interval: 200 ms
ECG QRS interval: 100 ms
EF: 60 %
Estimated workload: 9 METS
Heart Rate: 79 {beats}/min
Height: 67 in
LVOT Area (calculated): 3.46 cm2
LVOT Cardiac Index: 2.45 L/min/m2
LVOT Cardiac Output: 4.7 L/min
LVOT Diameter: 2.1 cm
LVOT PWD VTI: 17.2 cm
LVOT PWD Velocity (mean): 62.1 cm/s
LVOT PWD Velocity (peak): 78.9 cm/s
LVOT SV BSA Index: 31.01 mL/m2
LVOT SV Height Index: 35 mL/m
LVOT Stroke Rate (mean): 215 mL/s
LVOT Stroke Rate (peak): 273.1 mL/s
LVOT Stroke Volume: 59.54 mL
MPHR: 164 {beats}/min
Peak DBP: 47 mm[Hg]
Peak HR: 142 {beats}/min
Peak SBP: 156 mm[Hg]
Percent MPHR: 86.6 %
RPP: 22152 BPM x mmHG
RR Interval: 759.49 ms
SEM (LVOT Mean) mN-s: 38.85 mN-s
SEM (LVOT peak) mN-s: 49.36 mN-s
Stress Peak Stage: 2.68
Stress duration (min): 8 min
Stress duration (sec): 2 s
Weight (lbs): 172 [lb_av]
Weight: 2752 [oz_av]

## 2023-02-17 LAB — BASIC METABOLIC PANEL
Anion Gap: 9 (ref 7–16)
CO2: 30 mmol/L — ABNORMAL HIGH (ref 20–28)
Calcium: 9.2 mg/dL (ref 8.6–10.2)
Chloride: 103 mmol/L (ref 96–108)
Creatinine: 1.18 mg/dL — ABNORMAL HIGH (ref 0.67–1.17)
Glucose: 111 mg/dL — ABNORMAL HIGH (ref 60–99)
Lab: 15 mg/dL (ref 6–20)
Potassium: 4.7 mmol/L (ref 3.3–5.1)
Sodium: 142 mmol/L (ref 133–145)
eGFR BY CREAT: 72 *

## 2023-02-17 LAB — HEMOGLOBIN A1C: Hemoglobin A1C: 7.6 % — ABNORMAL HIGH

## 2023-02-17 LAB — POCT GLUCOSE
Glucose POCT: 67 mg/dL (ref 60–99)
Glucose POCT: 74 mg/dL (ref 60–99)
Glucose POCT: 145 mg/dL — ABNORMAL HIGH (ref 60–99)

## 2023-02-17 LAB — SPEC COAG REVIEW

## 2023-02-17 LAB — REVIEWED BY:

## 2023-02-17 LAB — INTERPRETATION,SPEC COAG

## 2023-02-17 MED ORDER — PERFLUTREN LIPID MICROSPHERE 1.1 MG/ML (DEFINITY) IV SUSP *I*
0.7500 mL | INTRAVENOUS | Status: AC | PRN
Start: 2023-02-17 — End: 2023-02-17
  Administered 2023-02-17 (×2): 0.75 mL via INTRAVENOUS

## 2023-02-17 MED ORDER — PERFLUTREN LIPID MICROSPHERE 1.1 MG/ML (DEFINITY) IV SUSP *I*
0.6500 mL | INTRAVENOUS | Status: DC | PRN
Start: 2023-02-17 — End: 2023-02-17
  Administered 2023-02-17: 0.65 mL via INTRAVENOUS

## 2023-02-17 NOTE — ED Notes (Signed)
Plan of Care     Telemetry  Exercise stress test   Self management of insulin pump  Vitals every 4 hours  Comfort measures as needed

## 2023-02-17 NOTE — ED Notes (Signed)
Patient's BG is 67, he states he turned off his insulin pump about an hour ago. Covering provider notified, small amount of apple juice given.

## 2023-02-17 NOTE — Progress Notes (Signed)
Division of Endocrinology, Diabetes & [facility], 3rd Floor  [address] [place], [place] [address]  Phone: [phone]     [facility] Endocrinology - Inpatient  Progress Note    Subjective: Patient seen and chart reviewed. Patient denies chest pain at present. He was awaiting stress test.     Objective:  Blood pressure 116/64, pulse 94, temperature 36.5 C (97.7 F), temperature source Temporal, resp. rate 16, height 1.702 m (5\' 7" ), weight 78 kg (172 lb), SpO2 98%.  GENERAL APPEARANCE: Alert, cooperative, no distress  NECK:  Supple, symmetrical  LUNGS:  Clear to auscultation bilaterally, respirations unlabored  HEART:  Regular rate and rhythm, S1, S2, normal, no murmur, rub or gallop  ABDOMEN:  Soft, non-tender, nondistended, bowel sounds active all four quadrants. Insulin pump intact at abdomen.   EXTREMITIES:  No edema.      Recent Labs   Lab 02/17/23  0441 02/16/23  1033   Sodium 142 138   Potassium 4.7 4.6   Chloride 103 102   CO2 30* 27   UN 15 15   Creatinine 1.18* 0.98   Calcium 9.2 9.5   Glucose 111* 139*     Recent Labs   Lab 02/16/23  1033   WBC 8.4   Hemoglobin 14.4   Hematocrit 43   Platelets 217      Recent Labs   Lab 02/16/23  1033   Hemoglobin A1C 7.6*     No results for input(s): "TSH", "T3F", "FT4" in the last 168 hours.    Nutrition-  Yesterday:   Breakfast:   Lunch:   Dinner:  Today:   Breakfast:    POCT BG Results:  Recent Labs   Lab 02/17/23  0926 02/17/23  0747 02/17/23  0726 02/16/23  2059 02/16/23  1721   Glucose POCT 145* 74 67 268* 97        Current Diabetes Regimen:  Tandem Tslim + Dexcom G7. Pump settings: Basal: 12A: 1.3 units/hour, 7A: 1.5 units/hour, 10P: 1 unit/hour = 33.6 units/day. Bolus: I:C: 1:7, sensitivity: 30 mg/dl, target: 578 mg/dl, active insulin time: 5 hours     Assessment:   Tyler Jenkins is a 58 y.o. male with type 1 diabetes, CAD, HLD, HTN, OSA who presents with intermittent chest pain since September.     Blood glucose decreased overnight -  patient reports that it usually rises overnight at home as eats late. Blood glucose improved with treatment with juice and suspension of pump x 1 hour. Discussed with patient use of exercise mode if BG trending lower.        Plan:  Continue current insulin pump settings  Provided patient with additional site as pulled out and a charger  Monitor BG q ac hs and prn with hospital meter  Continue personal CGM as well  Will follow    Discussed with Dr. Zane Herald, NP 02/17/2023 3:14 PM

## 2023-02-17 NOTE — Progress Notes (Signed)
FOLLOW-UP TO PREVIOUS VERBAL NOTIFICATION:    Pt discharged: Attempted to deliver copy of Observation LOC Notification  but patient has been discharged. Copy of notice will be mailed to address on file.    May Manrique, RN BSN  Utilization Management  [phone]

## 2023-02-17 NOTE — ED Obs Notes (Addendum)
PROGRESS NOTE [facility] - LOCATION: EOU     Patient seen by me today, 02/17/2023 at 1045    History     Chief Complaint:   Chief Complaint   Patient presents with    Chest Pain       Subjective:  Patient reports no pain today. Did have some small bouts yesterday that would come and go. No other concerns.    Physical Exam   Vitals: Patient Vitals for the past 24 hrs:   BP Temp Temp src Pulse Resp SpO2 Height Weight   02/17/23 0436 113/65 36 C (96.8 F) TEMPORAL 69 16 98 % -- --   02/17/23 0016 112/69 36 C (96.8 F) TEMPORAL 81 16 98 % -- --   02/16/23 2056 98/54 36.2 C (97.2 F) TEMPORAL 76 18 99 % -- --   02/16/23 1942 105/62 36.3 C (97.3 F) TEMPORAL 77 16 98 % -- --   02/16/23 1723 102/66 36 C (96.8 F) TEMPORAL 68 16 97 % -- --   02/16/23 1614 113/69 -- -- 74 18 100 % -- --   02/16/23 1009 137/79 36.4 C (97.5 F) TEMPORAL 81 18 100 % 1.702 m (5\' 7" ) 78 kg (172 lb)       Physical Exam  Vitals reviewed.   Eyes:      Extraocular Movements: Extraocular movements intact.   Cardiovascular:      Rate and Rhythm: Normal rate and regular rhythm.      Heart sounds: Normal heart sounds.   Pulmonary:      Effort: Pulmonary effort is normal.      Breath sounds: Normal breath sounds.   Musculoskeletal:      Right lower leg: No edema.      Left lower leg: No edema.         Tests     EKG interpretation: normal sinus rhythm, normal EKG     Telemetry interpretation: no arrhythmias noted     Labs:  All labs in the last 24 hours:   Recent Results (from the past 24 hours)   Troponin T 3 HR W/ Delta High Sensitivity    Collection Time: 02/16/23  1:57 PM   Result Value Ref Range    TROP T 3 HR High Sensitivity 7 0 - 14 ng/L    TROP T 0-3 HR DELTA High Sensitivity -2 (L) 0 - 4   POCT glucose    Collection Time: 02/16/23  5:21 PM   Result Value Ref Range    Glucose POCT 97 60 - 99 mg/dL   D-dimer, quantitative    Collection Time: 02/16/23  5:31 PM   Result Value Ref Range    D-Dimer 0.43 0.00 - 0.50 ug/mL FEU   POCT glucose     Collection Time: 02/16/23  8:59 PM   Result Value Ref Range    Glucose POCT 268 (H) 60 - 99 mg/dL   Basic metabolic panel    Collection Time: 02/17/23  4:41 AM   Result Value Ref Range    Glucose 111 (H) 60 - 99 mg/dL    Sodium 098 119 - 147 mmol/L    Potassium 4.7 3.3 - 5.1 mmol/L    Chloride 103 96 - 108 mmol/L    CO2 30 (H) 20 - 28 mmol/L    Anion Gap 9 7 - 16    UN 15 6 - 20 mg/dL    Creatinine 8.29 (H) 0.67 - 1.17 mg/dL    eGFR  BY CREAT 72 *    Calcium 9.2 8.6 - 10.2 mg/dL   POCT glucose    Collection Time: 02/17/23  7:26 AM   Result Value Ref Range    Glucose POCT 67 60 - 99 mg/dL   POCT glucose    Collection Time: 02/17/23  7:47 AM   Result Value Ref Range    Glucose POCT 74 60 - 99 mg/dL   Echo Complete    Collection Time: 02/17/23  8:55 AM   Result Value Ref Range    BSA 1.92 m2    Height 67 in    BMI 27 kg/m2    Weight 2,752 oz    Weight (lbs) 172.00 lbs    LV Septal Thickness 0.87 cm    LVED Diameter 4.39 cm    LVOT Diameter 2.10 cm    LV Posterior Wall Thickness 0.86 cm    LA Diameter 3.4 cm    MV Peak A Velocity 84.9 cm/s    Deceleration Time - MV 213 ms    MV Peak E Velocity 78.0 cm/s    Mitral Annular Ea Velocity 9.10 cm/s    Aortic Diameter (mid tubular) 3.2 cm    Aortic Diameter (sinus of Valsalva) 3.1 cm    LVED Diameter BSA Index 2.29 cm/m2    LVED Diameter Height Index 2.58 cm/m    LV ASE Mass 120.9 gm    LV ASE Mass Height 2.7 Index 28.8 gm/m2.7    LV ASE Mass BSA Index 63.0 gm/m2    LV ASE Mass Height Index 71.1 gm/m    LV wall/cavity ratio 0.39     LVOT Area (calculated) 3.46 cm2    E/A ratio 0.92     Mitral Annular E/Ea Vel Ratio 8.57     LA Diameter BSA Index 1.8 cm/m2    LA Diameter Height Index 2.0 cm/m    Heart Rate 69 bpm    RR Interval 869.57 ms    BP Systolic 113 mmHg    BP Diastolic 65 mmHg    LA Systolic Volume 20.0 mL    LA Systolic Vol BSA Index 10.4 mL/m2    LA Systolic Vol Height Index 11.8 mL/m    Tricuspid Annular S velocity 12.00 cm/s    IVC Diameter 0.7 cm    RA Volume 31.0  mL    RA Volume BSA Index 16.1 mL/m2    RA Volume Height Index 18.2 mL/m    LVOT PWD VTI 18.10 cm    LVOT PWD Velocity (peak) 86.0 cm/s    LVOT PWD Velocity (mean) 59.0 cm/s    LVOT Stroke Volume 62.66 cc    LVOT Cardiac Output 4.32 L/min    LVOT SV BSA Index 32.64 mL/m2    LVOT SV Height Index 36.8 mL/m    LVOT Cardiac Index 2.25 L/min/m2    LVOT Stroke Rate (peak) 297.7 mL/s    LVOT Stroke Rate (mean) 204.2 mL/s    SEM (LVOT peak) mN-s 56.61 mN-s    SEM (LVOT Mean) mN-s 38.84 mN-s    EF 60 %   POCT glucose    Collection Time: 02/17/23  9:26 AM   Result Value Ref Range    Glucose POCT 145 (H) 60 - 99 mg/dL       Imaging results:  Echo Complete    Result Date: 02/17/2023  Normal LV size and mass, no regional dysfunction and normal LV ejection fraction No significant valvular abnormalities. Unable to reliably estimate RV  systolic pressure but no evidence of significant right heart enlargement/dysfunction. No prior exam for comparison.     *Chest standard frontal and lateral views    Result Date: 02/16/2023  02/16/2023 6:01 PM CHEST X-RAYS CLINICAL INFORMATION: intermittent central/substernal chest pain. COMPARISON: None. PROCEDURE: Frontal and lateral projections of the chest were obtained. FINDINGS: Tubes and Catheters: None. Central Airways: Normal. Lungs/Pleura/Pleural space: Normal. Heart and Mediastinum: Normal. Additional Findings: No acute or aggressive osseous changes noted.     No acute cardiopulmonary disease. END OF IMPRESSION       [facility] submits this DICOM format image data and final report to the [facility], an independent secure electronic health information exchange, on a reciprocally searchable basis (with patient authorization) for a minimum of 12 months after exam date.      Independent interpretation of imaging: N/A     Medical Decision Making   Assessment: 58 y.o. male w/ hx of T1DM, CAD (2018 cath with 50% blockage in LAD), untreated OSA, HTN, HLD has been placed on the EOU service for Chest  Pain. History is atypical for cardiac chest pain. Had normal resting TTE this AM, awaiting limited exercise stress echo this afternoon. Pending that result may be able to be discharged home.           Problems addressed and plan:  Chest Pain  Hx of CAD (2018 cath with 50% LAD blockage, not stented), hx of abnl exercise stress test  HTN, HLD  - suspect stable vs unstable angina, or costochondritis/GERD  - trops, EKG reassuring in ED  -tele, TTE reassuring. Awaiting limited stress echo  - prn nitroglycerin for recurrence of pain  - would repeat EKG/trop if pain recurs and persists > 5 minutes  - lipids with LDL 41  - cont home coreg, losartan, crestor, ASA 81 daily  - d dimer 0.43, PE unlikely     T1DM  - uses insulin pump, consulted endo  - had low normal BG this AM. He briefly turned off pump. I had him drink juice with improvement in BG and he turned pump back on  - cont nightly gabapentin for neuropathic pain    Discussions with Other Providers: Cardiology and endocrinology  Consultant teams previously spoken with by prior OBS Providers: ED Provider and Endocrinology    Parenteral Controlled Substances: none  DVT prophylaxis: None  Code status: FULL    SDoH affecting care (Long term issues affecting a patient's overall health):   None identified    Disposition Barriers (More acute issues that are limiting a patient's discharge today):   None identified        I personally spent 60 minutes on the calendar date of the encounter, including and pre and post visit work.              Author: [name], MD       [name], MD  02/17/23 1244      3:19 PM  Stress test negative, okay for discharge per cardiology.     DISCHARGE ADDENDUM    Admit diagnosis:   chest pain  Discharge diagnosis:  non cardiac chest pain, likely GERD or sleep apnea  Pending labs: none  Med changes: none  Dispo to: home  D/c condition: stable, chest pain free, tolerating PO, ambulatory  Follow ups:  PCP and cardiology. I  recommended asking PCP to see sleep medicine again as I wonder if some of his nighttime symptoms are related to his untreated OSA  Patient Instructions:     You came to the emergency room for chest pain. We evaluated you with a echocardiogram and stress test which did not show any cardiac causes for your pain or signs of a new blockage around your heart. Cardiology evaluated you, reviewed the testing, and felt comfortable with discharge home.     Medication Changes:  None    Follow ups:  With PCP  With cardiologist Dr. Chales Abrahams (they will call you to schedule)    Return to the emergency room if you develop chest pain that does not go away with rest, or if you suddenly pass out    Lorain Childes, MD  Emergency Medicine Observation Unit Attending        [name], MD  02/17/23 1520

## 2023-02-17 NOTE — ED Notes (Signed)
Assessment completed at approximately 0920. At that time, patient denied chest pain, nausea, dizziness, shortness of breath. Telemetry on, call bell within reach. Will continue to monitor.

## 2023-02-17 NOTE — Discharge Instructions (Addendum)
You came to the emergency room for chest pain. We evaluated you with a echocardiogram and stress test which did not show any cardiac causes for your pain or signs of a new blockage around your heart. Cardiology evaluated you, reviewed the testing, and felt comfortable with discharge home.     Medication Changes:  None    Follow ups:  With PCP  With cardiologist Dr. Chales Abrahams (they will call you to schedule)    Return to the emergency room if you develop chest pain that does not go away with rest, or if you suddenly pass out

## 2023-02-17 NOTE — Provider Consult (Signed)
Cardiology Consult Note    Date of Consult: 02/17/2023    Name: Tyler Jenkins Attending: Lorain Childes Ensinger*   DOB: 1965/12/27 PCP: Saverio Danker, MD   MRN: [id number] Primary Cardiologist: None   CSN: [id number] Admit Date:  02/16/2023     History of Present Illness     I had the pleasure of seeing Tyler Jenkins in cardiology consult on 02/17/2023. Tyler Jenkins is an 58 y.o. male who we were asked to see for chest pain.    58 yo male with a history of CAD(50% LAD lesion) which was previously diagnosed off of a false positive nuclear stress test(showed inferior ischemia) which was not intervened upon, HLD(controlled) and type 1 diabetes who is presenting with chest pain.     Pt states he has had about 6 episodes of chest pain this past month. 3 have happened at night and have woken him up and the other 3 have happened in the morning right before he starts working. He works from home for [employer]. He denies being on an structured exercise regiment however he does not endorse significant exertional chest pain. He does not have any heart failure symptoms. His LDL was recently well controlled. His EKG did not show any evidence of ischemia. 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    Medications and Allergies   (Not in a hospital admission)    Current Facility-Administered Medications   Medication Dose Route Frequency    insulin lispro   Subcutaneous TID AC    aspirin  81 mg Oral Daily    gabapentin  600 mg Oral Nightly    rosuvastatin  20 mg Oral Daily     He has No Known Allergies (drug, envir, food or latex).    Social and Family History     Family History   Problem Relation Age of Onset    Diabetes Type I Brother     Diabetes Type II Paternal Grandfather      Social History     Socioeconomic History    Marital status: Married    Tobacco Use    Smoking status: Never    Smokeless tobacco: Never   Substance and Sexual Activity    Alcohol use: Yes     Comment: twice weekly    Drug use: Never         Review of Systems     ROS    Vitals and Physical Exam     Vitals: reviewed and notable for:  Current Vitals Vitals Range (24 Hours)   BP 113/65 (BP Location: Right arm)   Pulse 69   Temp 36 C (96.8 F) (Temporal)   Resp 16   Ht 1.702 m (5\' 7" )   Wt 78 kg (172 lb)   SpO2 98%   BMI 26.94 kg/m  Temp:  [36 C (96.8 F)-36.4 C (97.5 F)] 36 C (96.8 F)  Heart Rate:  [68-81] 69  Resp:  [16-18] 16  BP: (98-137)/(54-79) 113/65       Physical exam:  General: No acute distress. Resting comfortably in bed seen after getting his echo   Neuro: Alert and oriented x3.   HEENT: atraumatic normocephalic. No scleral icterus. Moist mucus membranes  Cardiovascular: RRR, no g/m/r, normal S1 and S2  Pulmonary: Vesicular breath sounds bilaterally. No rales rhonchi or wheezes. Normal respiratory effort  Abdominal: NT. ND. Normal bowel sounds  Extremities: 2+ distal pulses. No edema noted  Skin: warm, dry no rash noted.        Labs: per hpi     Radiology: per hpi       Impression and Plan     Assessment:   Tyler Jenkins is a 58 y.o. male with history notable for CAD(50% LAD lesion) which was previously diagnosed off of a false positive nuclear stress test(showed inferior ischemia) which was not intervened upon, HLD(controlled) and type 1 diabetes who is presenting with chest pain. His chest pain has atypical features. He describes a pain at rest which is substernal and feels like a tightness. Given his known history of CAD we will proceed with limited exercise stress echo. His resting echo is pending currently.     - please order limited stress echo  - continue home aspirin and statin   - hold home carvedilol (last dose was last night)     [name], MD  Electronically signed on 02/17/2023 at 9:02 AM.

## 2023-02-17 NOTE — ED Notes (Signed)
Pt off unit to echo, ok to travel off tele per provider order.

## 2023-02-17 NOTE — ED Notes (Signed)
Pt to exercise stress test

## 2023-02-17 NOTE — Progress Notes (Addendum)
02/17/23 1100   [facility] Patient Class Review   Patient Class Review Observation     Patient class effective as of 02/16/23.     11:55 AM  Writer reviewed Observation LOC Notification  with Patient on 02/17/23 at the time of 11:54 via phone # [phone] ext [phone]. This was a verbal notification & a [facility] RN will be following up to deliver the physical notification form reviewed. A signature of receipt will be required at that time.  If the patient or their representative have additional questions after notification, they have been provided their Utilization Management's contact information, [phone], at the time of verbal notification & is also included on the written form once delivered.     Loma Messing RN, BSN  [facility] Utilization Management  Ext [phone]  Secure chat

## 2023-02-18 ENCOUNTER — Telehealth: Payer: Self-pay | Admitting: Cardiology

## 2023-02-18 NOTE — Telephone Encounter (Signed)
[name], MD  [facility] Card Team 30  Hello,    Can we schedule a follow up with this gentleman with me in the next few weeks. Thanks!!    Tyler Jenkins

## 2023-02-18 NOTE — Telephone Encounter (Signed)
1/8 left message for patient to call back and schedule follow up with Dr.Gupta. patient was seen by Dr.Gupta during admission in hospital.

## 2023-02-18 NOTE — Telephone Encounter (Signed)
1/8 spoke with patient and scheduled follow up he wants to see Dr.Gupta in , he is aware he does not go to [place]

## 2023-02-27 LAB — EKG 12-LEAD
P: 56 deg
PR: 139 ms
QRS: 30 deg
QRSD: 102 ms
QT: 393 ms
QTc: 419 ms
Rate: 68 {beats}/min
T: 266 deg

## 2023-03-12 ENCOUNTER — Ambulatory Visit: Payer: PRIVATE HEALTH INSURANCE | Admitting: Cardiology

## 2023-03-25 ENCOUNTER — Ambulatory Visit: Payer: Medicaid Other

## 2023-03-25 ENCOUNTER — Other Ambulatory Visit: Payer: Self-pay

## 2023-03-26 ENCOUNTER — Ambulatory Visit: Payer: Medicaid Other | Admitting: Cardiology

## 2023-03-26 ENCOUNTER — Encounter: Payer: Self-pay | Admitting: Cardiology

## 2023-03-26 VITALS — BP 110/60 | HR 85 | Ht 67.75 in | Wt 175.0 lb

## 2023-03-26 DIAGNOSIS — E785 Hyperlipidemia, unspecified: Secondary | ICD-10-CM

## 2023-03-26 DIAGNOSIS — I10 Essential (primary) hypertension: Secondary | ICD-10-CM

## 2023-03-26 DIAGNOSIS — I2510 Atherosclerotic heart disease of native coronary artery without angina pectoris: Secondary | ICD-10-CM

## 2023-03-26 MED ORDER — NITROGLYCERIN 0.4 MG SL SUBL *I*
0.4000 mg | SUBLINGUAL_TABLET | SUBLINGUAL | 0 refills | Status: AC | PRN
Start: 2023-03-26 — End: ?

## 2023-03-26 NOTE — Progress Notes (Signed)
Cardiology Progress Note    Chief Complaint:    Chief Complaint   Patient presents with    Follow-up            HPI      Tyler Jenkins is a 58 y.o. male with a PMHx significant for diabetes, HTN, CAD who is presenting for a follow up visit     Pt states he has been dong well since he left hospital. He has started taking Prilosec which he thinks may have helped his pain. He states on the way here he notices some chest discomfort while sitting. Center in his chest felt like a stabbing pain.     He is not currently exercising. 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 He is starting to become more active while exercising on the weekends. No chest pain or pressure when he exercises. He can get short of breath however he thinks he is out of shape.     Social Hx: lives in [place] with his wife, son and father in [employer]. Former smoker in [employer]. Social alcohol use       Patient's allergies, past medical, surgical, social and family histories were reviewed, updated.       Medications:   Medications Ordered Prior to Encounter[1]    Medications reviewed and reconciled.     Review of Systems:     Pertinent positives and negatives per HPI      Physical Exam:     Vitals:    03/26/23 1444   BP: 110/60   BP Location: Right arm   Patient Position: Sitting   Cuff Size: adult   Pulse: 85   SpO2: 97%   Weight: 79.4 kg (175 lb)   Height: 1.721 m (5' 7.75")     Wt Readings from Last 3 Encounters:   03/26/23 79.4 kg (175 lb)   02/17/23 78 kg (172 lb)   10/30/20 76.1 kg (167 lb 12.8 oz)       General: well appearing   Neuro: alert and conversant   Cardiovascular: rrr normal s1 and s2 no murmurs   Pulmonary: ctab normal wob speaking in full sentences  Extremities: no peripheral edema         Assessment and Plan:   58 y.o. male with history notable for CAD(50% LAD lesion) which was previously diagnosed off of a false positive nuclear stress test(showed inferior ischemia) which was not intervened upon, HLD(controlled) and type 1 diabetes who  was seen in the ED for chest pain.     CAD - exercise stress echo from 02/17/23 without evidence of ischemia and patient exercised for 8 minutes without developing chest pain. Prior 50% LAD lesion seen on a prior angiogram   - continue aspirin and rosuvastatin 40 mg daily    - his LDL if 41   - instructed to let me know if he needs to use sublingual nitroglycerin. Also informed him he cannot take sublingual nitroglycerin if he took cialis within the past 48 hours     HTN: goal BP < 130/80 mmHg  -continue losartan 25 mg daily   -continue carvedilol 6.25 mg BID     Follow-up: 6 months    [name], MD 03/26/2023 2:48 PM  Cardiovascular Disease    There are no Patient Instructions on file for this visit.         [1]   Current Outpatient Medications on File Prior to Visit   Medication Sig Dispense Refill    glucagon (  BAQSIMI) 3 MG/DOSE nasal powder Inhale one dose (3 mg) into one nostril once as needed for low blood sugar. If no response in 15 min, inhale second dose from new device 2 each 3    insulin glargine 100 unit/mL injection pen Inject into the skin once daily as directed if insulin pump fails. Maximum daily dose 33 units. Discard each pen 28 days after first use. 15 mL 1    Insulin Pen Needle (PEN NEEDLES) 31G X 6 MM Use 1 times a day as instructed if insulin pump fails 100 each 1    insulin lispro 100 UNIT/ML injection vial Inject 20 units into the skin 3 times daily (before meals)  Maximum   Units/day. Discard vial 28 days after first use. 54 mL 0    rosuvastatin (CRESTOR) 40 mg tablet Take 0.5 tablets (20 mg total) by mouth daily.      carvedilol (COREG) 6.25 mg tablet Take 1 tablet (6.25 mg total) by mouth 2 times daily (with meals).      losartan (COZAAR) 25 mg tablet Take 1 tablet (25 mg total) by mouth daily.      gabapentin (NEURONTIN) 300 MG capsule Take 1 capsule (300 mg total) by mouth daily as needed.      tadalafil (CIALIS) 20 MG tablet Take 20 mg by mouth daily as needed for Erectile Dysfunction   Take at least 30 minutes prior to sexual activity.      aspirin EC (ASPIRIN EC) 81 mg EC tablet Take 1 tablet (81 mg total) by mouth daily.      cholecalciferol (VITAMIN D) 50 MCG (2000 UT) tablet Take 1 tablet (2,000 units total) by mouth daily.      zinc sulfate (ZINCATE) 220 (50 Zn) MG capsule Take 220 mg by mouth daily       No current facility-administered medications on file prior to visit.

## 2023-09-16 ENCOUNTER — Ambulatory Visit: Admitting: Cardiology

## 8044-06-10 DEATH — deceased
